# Patient Record
Sex: Female | Born: 1970 | Race: White | Hispanic: No | Marital: Married | Smoking: Former smoker
Health system: Southern US, Community
[De-identification: ages and names within clinical notes are randomized; demographics above are authoritative.]

## PROBLEM LIST (undated history)

## (undated) DIAGNOSIS — I4510 Unspecified right bundle-branch block: Secondary | ICD-10-CM

## (undated) DIAGNOSIS — R519 Headache, unspecified: Secondary | ICD-10-CM

## (undated) DIAGNOSIS — F32A Depression, unspecified: Secondary | ICD-10-CM

## (undated) DIAGNOSIS — Z87442 Personal history of urinary calculi: Secondary | ICD-10-CM

## (undated) DIAGNOSIS — I10 Essential (primary) hypertension: Secondary | ICD-10-CM

## (undated) DIAGNOSIS — F329 Major depressive disorder, single episode, unspecified: Secondary | ICD-10-CM

## (undated) DIAGNOSIS — R51 Headache: Secondary | ICD-10-CM

## (undated) DIAGNOSIS — Z8719 Personal history of other diseases of the digestive system: Secondary | ICD-10-CM

## (undated) DIAGNOSIS — K219 Gastro-esophageal reflux disease without esophagitis: Secondary | ICD-10-CM

## (undated) HISTORY — PX: TUBAL LIGATION: SHX77

## (undated) HISTORY — PX: CHOLECYSTECTOMY: SHX55

## (undated) HISTORY — PX: THYROIDECTOMY: SHX17

---

## 1999-08-12 ENCOUNTER — Encounter: Admission: RE | Admit: 1999-08-12 | Discharge: 1999-08-12 | Payer: Self-pay | Admitting: *Deleted

## 1999-08-12 ENCOUNTER — Encounter: Payer: Self-pay | Admitting: *Deleted

## 1999-09-28 ENCOUNTER — Other Ambulatory Visit: Admission: RE | Admit: 1999-09-28 | Discharge: 1999-09-28 | Payer: Self-pay | Admitting: Obstetrics and Gynecology

## 2007-07-24 ENCOUNTER — Emergency Department: Payer: Self-pay | Admitting: Emergency Medicine

## 2007-07-24 ENCOUNTER — Other Ambulatory Visit: Payer: Self-pay

## 2009-01-22 ENCOUNTER — Emergency Department (HOSPITAL_COMMUNITY): Admission: EM | Admit: 2009-01-22 | Discharge: 2009-01-22 | Payer: Self-pay | Admitting: Emergency Medicine

## 2009-03-17 ENCOUNTER — Emergency Department (HOSPITAL_COMMUNITY): Admission: EM | Admit: 2009-03-17 | Discharge: 2009-03-17 | Payer: Self-pay | Admitting: Family Medicine

## 2009-10-02 ENCOUNTER — Emergency Department (HOSPITAL_COMMUNITY): Admission: EM | Admit: 2009-10-02 | Discharge: 2009-10-02 | Payer: Self-pay | Admitting: Emergency Medicine

## 2009-10-15 ENCOUNTER — Ambulatory Visit: Payer: Self-pay | Admitting: Internal Medicine

## 2009-10-26 ENCOUNTER — Ambulatory Visit: Payer: Self-pay | Admitting: Surgery

## 2009-11-02 ENCOUNTER — Ambulatory Visit: Payer: Self-pay | Admitting: Surgery

## 2010-09-06 ENCOUNTER — Ambulatory Visit: Payer: Self-pay | Admitting: Surgery

## 2010-09-07 ENCOUNTER — Ambulatory Visit: Payer: Self-pay | Admitting: Surgery

## 2010-09-08 LAB — PATHOLOGY REPORT

## 2010-10-11 LAB — POCT I-STAT, CHEM 8
BUN: 10 mg/dL (ref 6–23)
Calcium, Ion: 1.2 mmol/L (ref 1.12–1.32)
Chloride: 105 mEq/L (ref 96–112)
Creatinine, Ser: 0.7 mg/dL (ref 0.4–1.2)
Glucose, Bld: 94 mg/dL (ref 70–99)
HCT: 44 % (ref 36.0–46.0)
Hemoglobin: 15 g/dL (ref 12.0–15.0)
Potassium: 3.7 mEq/L (ref 3.5–5.1)
Sodium: 139 mEq/L (ref 135–145)
TCO2: 27 mmol/L (ref 0–100)

## 2010-10-11 LAB — POCT CARDIAC MARKERS
CKMB, poc: <1 ng/mL — ABNORMAL LOW (ref 1.0–8.0)
Myoglobin, poc: 57.9 ng/mL (ref 12–200)
Troponin i, poc: <0.05 ng/mL (ref 0.00–0.09)

## 2010-10-24 LAB — URINALYSIS, ROUTINE W REFLEX MICROSCOPIC
Glucose, UA: NEGATIVE mg/dL
Leukocytes, UA: NEGATIVE
Protein, ur: 30 mg/dL — AB

## 2010-10-24 LAB — DIFFERENTIAL
Basophils Absolute: 0.3 10*3/uL — ABNORMAL HIGH (ref 0.0–0.1)
Basophils Relative: 3 % — ABNORMAL HIGH (ref 0–1)
Eosinophils Absolute: 0.1 10*3/uL (ref 0.0–0.7)
Eosinophils Relative: 1 % (ref 0–5)
Lymphocytes Relative: 23 % (ref 12–46)

## 2010-10-24 LAB — CBC
HCT: 38.9 % (ref 36.0–46.0)
MCV: 92.3 fL (ref 78.0–100.0)
Platelets: 316 10*3/uL (ref 150–400)
RDW: 12.6 % (ref 11.5–15.5)

## 2010-10-24 LAB — PREGNANCY, URINE: Preg Test, Ur: NEGATIVE

## 2010-10-24 LAB — POCT I-STAT, CHEM 8
Hemoglobin: 13.6 g/dL (ref 12.0–15.0)
Sodium: 139 mEq/L (ref 135–145)
TCO2: 20 mmol/L (ref 0–100)

## 2010-10-24 LAB — URINE MICROSCOPIC-ADD ON

## 2011-01-14 ENCOUNTER — Ambulatory Visit: Payer: Self-pay

## 2011-02-02 ENCOUNTER — Encounter: Payer: Self-pay | Admitting: Rheumatology

## 2011-02-16 ENCOUNTER — Encounter: Payer: Self-pay | Admitting: Rheumatology

## 2011-04-28 ENCOUNTER — Other Ambulatory Visit: Payer: Self-pay | Admitting: Unknown Physician Specialty

## 2011-08-29 ENCOUNTER — Ambulatory Visit: Payer: Self-pay | Admitting: Obstetrics and Gynecology

## 2011-08-29 LAB — CBC
HCT: 38.2 % (ref 35.0–47.0)
MCHC: 33.3 g/dL (ref 32.0–36.0)
MCV: 91 fL (ref 80–100)
RDW: 12.8 % (ref 11.5–14.5)

## 2011-08-29 LAB — PREGNANCY, URINE: Pregnancy Test, Urine: NEGATIVE m[IU]/mL

## 2011-08-29 LAB — POTASSIUM: Potassium: 3.6 mmol/L (ref 3.5–5.1)

## 2011-09-09 ENCOUNTER — Ambulatory Visit: Payer: Self-pay | Admitting: Gastroenterology

## 2012-10-10 IMAGING — CT CT ABD-PELV W/ CM
1 of 2 series · 15 of 32 positions shown, 19 images · non-contrast
Comparison: none

REASON FOR EXAM: nausea abd pain RLQ
COMMENTS:

[Series 2: 3mm soft tissue · axial · 0.78mm/px · z∈[-493,-79]mm · 15 of 150 slices shown, 19 images]
[im 6/150  soft-tissue]
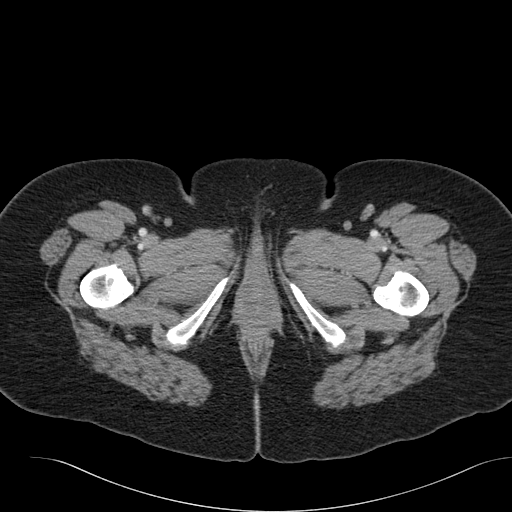
[im 6/150  bone]
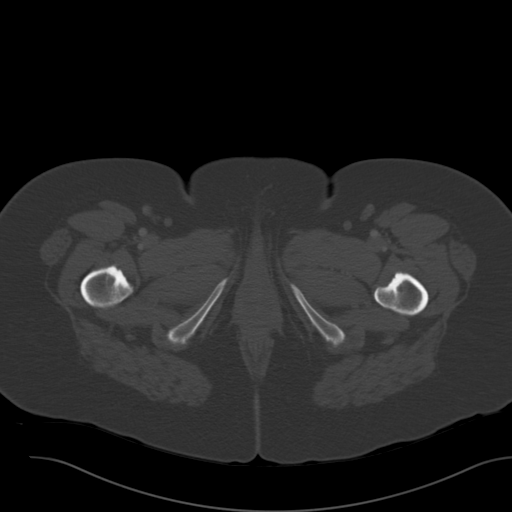
[im 18/150  soft-tissue]
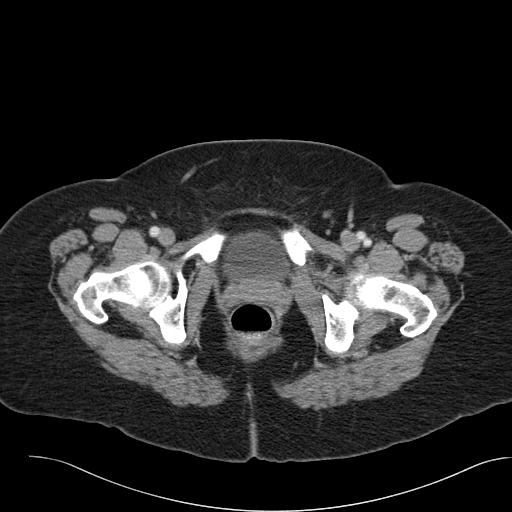
[im 30/150  soft-tissue]
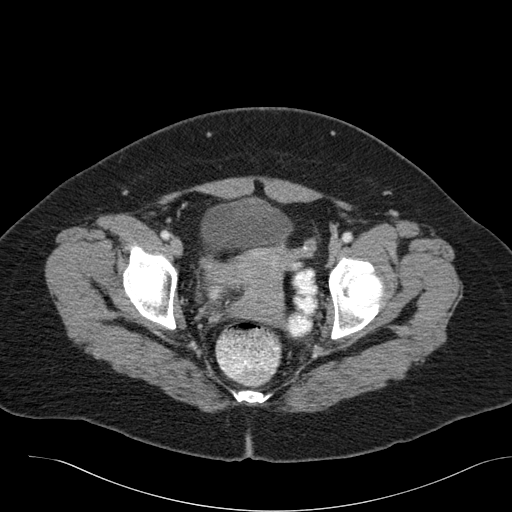
[im 42/150  soft-tissue]
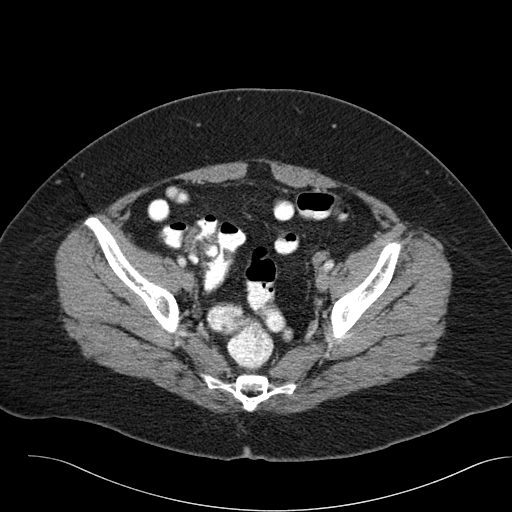
[im 54/150  soft-tissue]
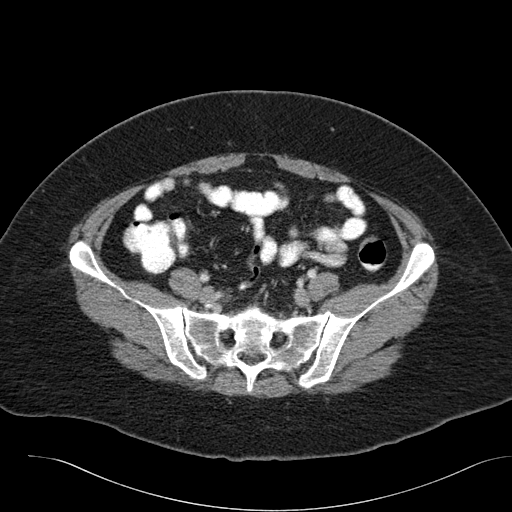
[im 66/150  soft-tissue]
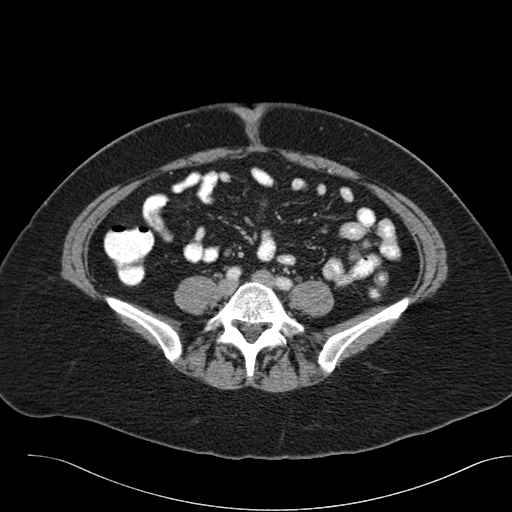
[im 78/150  soft-tissue]
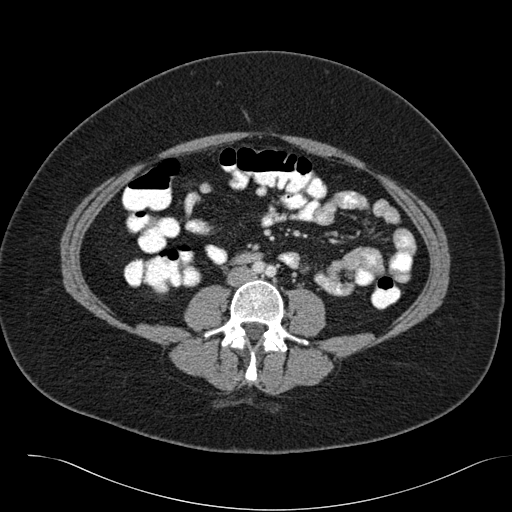
[im 84/150  soft-tissue]
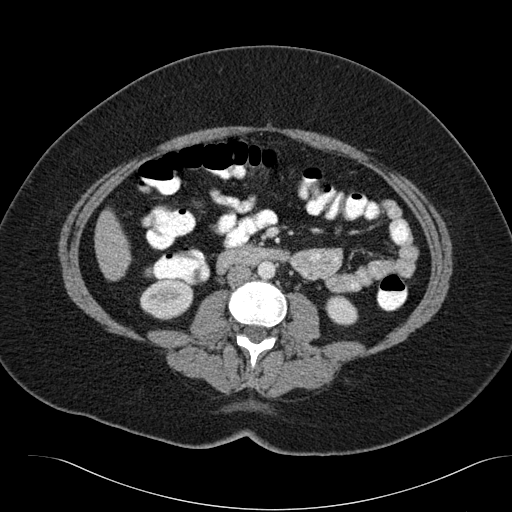
[im 96/150  soft-tissue]
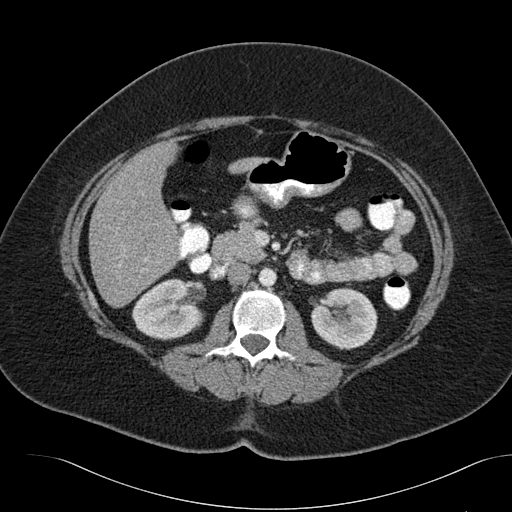
[im 96/150  bone]
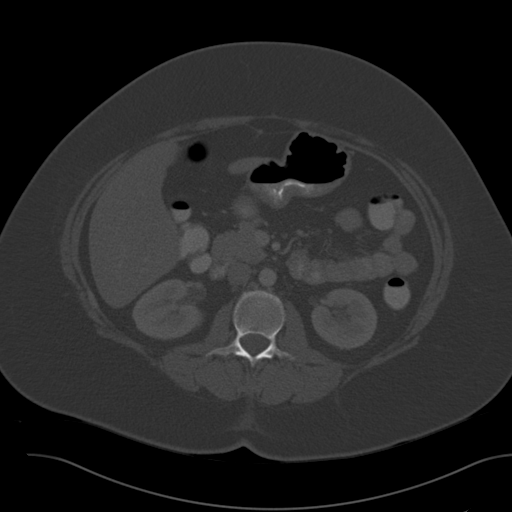
[im 108/150  soft-tissue]
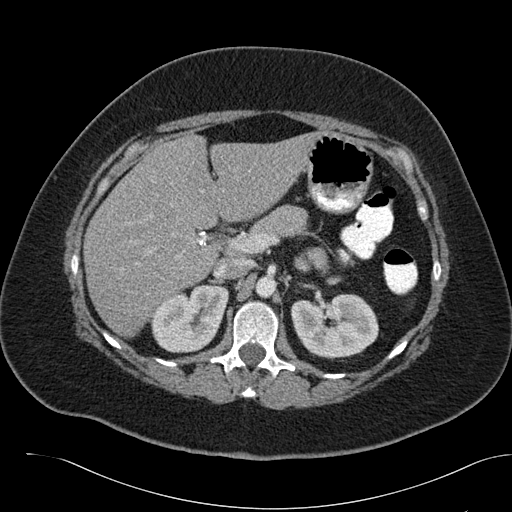
[im 120/150  soft-tissue]
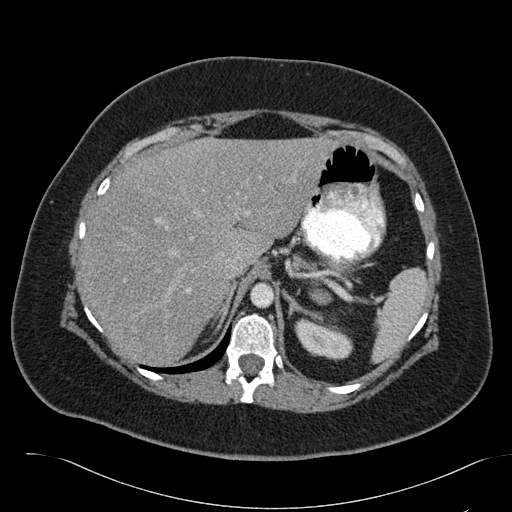
[im 126/150  lung]
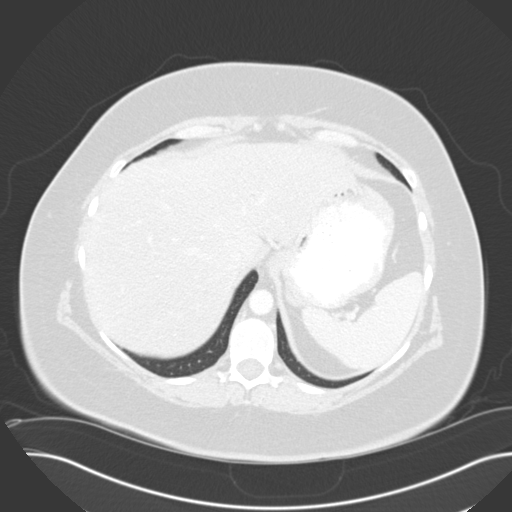
[im 132/150  soft-tissue]
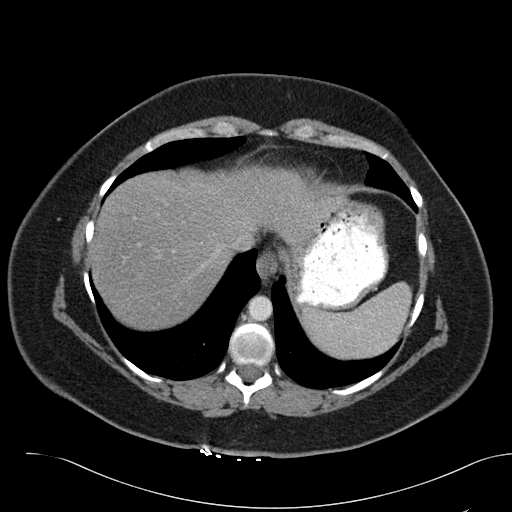
[im 132/150  lung]
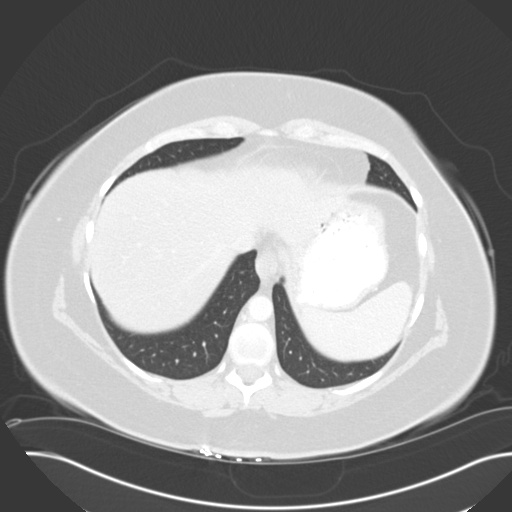
[im 138/150  lung]
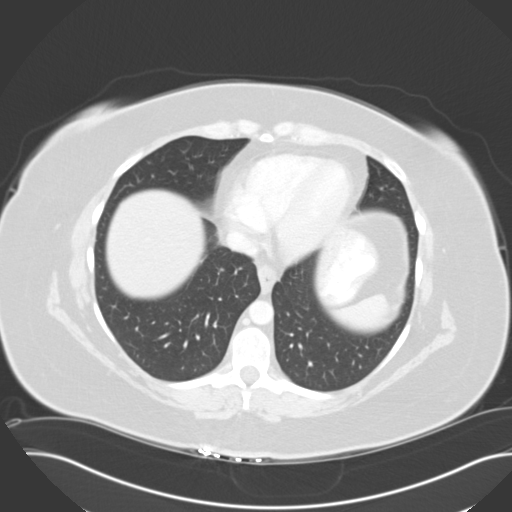
[im 144/150  soft-tissue]
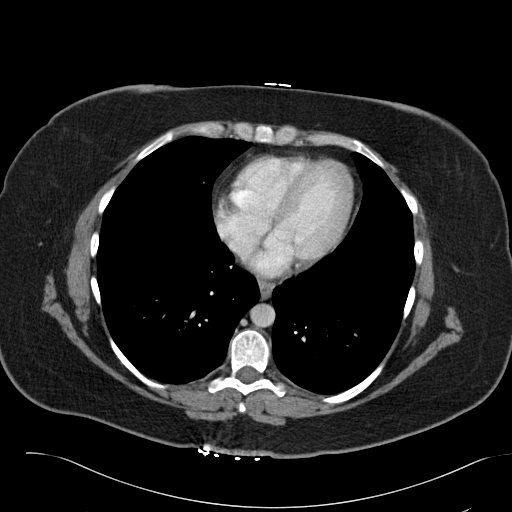
[im 144/150  lung]
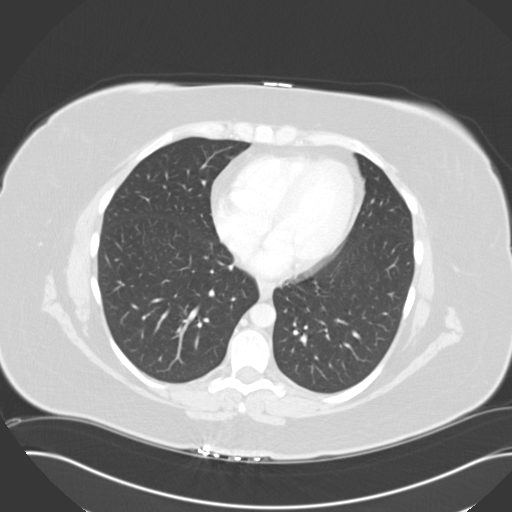

[15 of 32 positions shown; findings below may reference images not displayed]

PROCEDURE:     CT  - CT ABDOMEN / PELVIS  W  - September 09, 2011  [DATE]

RESULT:     Axial imaging was performed through the abdomen and pelvis with
reconstructions at 3 mm intervals and slice thicknesses. The patient
received 100 cc of Wsovue-JXX as well as oral contrast material. Review of
multiplanar reconstructed images was performed separately on the VIA monitor.

The gallbladder is surgically absent. The liver exhibits normal density with
no focal mass or ductal dilation. The spleen, partially distended stomach,
pancreas, adrenal glands, and kidneys are normal in appearance. The orally
administered contrast has traversed the small and large bowel. There is no
evidence of ileus nor of obstruction. A normal appearing contrast and gas
filled appendix is seen on images 95 through 104.

There is soft tissue fullness in the right aspect of the pelvis which may
reflect an inflamed ovary and fallopian tube. There are surgical clips
present in the pelvis from previous tubal ligation. I see no inflammatory
changes in the mesenteric fat of the right lower quadrant of the abdomen nor
of the pelvis. There is no free fluid in the pelvis. The uterus and urinary
bladder are normal in appearance. There is no inguinal hernia. There is a
shallow umbilical hernia. The lumbar vertebral bodies are preserved in
height. There is disc space narrowing at L5-S1. The lung bases are clear.
IMPRESSION: 1. There is no evidence of bowel obstruction or ileus or acute inflammation.
A normal appendix is demonstrated.
2. There is mild soft tissue fullness in the right adnexal region beginning
on image 109 and extending to image 120. This may reflect inflammatory
change associated with the right ovary and fallopian tube. The patient has
undergone previous tubal ligation. The uterus is normal in appearance.
3. I see no evidence of urinary tract stones or obstruction.
4. I see no acute hepatobiliary abnormality.

## 2014-11-09 NOTE — Op Note (Signed)
PATIENT NAMGarner Horn:  [name] MR#:  [id number] DATE OF BIRTH:  Jan 10, 1971  DATE OF PROCEDURE:  08/29/2011  PREOPERATIVE DIAGNOSIS: Desires sterilization.   POSTOPERATIVE DIAGNOSES:  1. Desires sterilization.  2. Suspect endometriosis.   OPERATIVE PROCEDURES: Laparoscopy with bilateral tubal banding with Falope ring.  SURGEON: Prentice DockerMartin A. Eupha Lobb, M.D.   FIRST ASSISTANT: None.   ANESTHESIA: General orotracheal.   INDICATION: The patient is a 44 year old white female para 0, on Micronor for contraception, who presents for surgical sterilization. The patient does not desire future children. She has a history of chronic hypertension and is unable to take combined oral contraceptives. Her husband declines vasectomy. The patient does have a history of dysmenorrhea and inspection for possible endometriosis was desired.  FINDINGS AT SURGERY: Grossly normal appearing uterus, tubes, and ovaries. There was evidence of some powder burn implants and scarring within the cul-de-sac. There also was some hyperemia and pseudofenestrations in the left pelvic sidewall. Likewise there was several powder burn implants on the right anterior abdominal wall just above the pelvic brim.   DESCRIPTION OF PROCEDURE: The patient was brought to the Operating Room where she was placed in the supine position. General endotracheal anesthesia was induced without difficulty. She was placed in the dorsal lithotomy position using the bumblebee stirrups. A ChloraPrep and Betadine abdominal, perineal, and intravaginal prep and drape was performed in the standard fashion. A red Robinson catheter was used to drain the bladder of urine. A Hulka tenaculum was placed onto the cervix to facilitate uterine manipulation. A subumbilical incision in a vertical manner was placed, 5 mm in length. The Optiview laparoscopic trocar system was used to place a 5 mm trocar directly into the abdomen without evidence of bowel or vascular injury. Thus  pneumoperitoneum was created. Inspection of the pelvis revealed the above-noted findings, which were photo-documented. An 8 mm port was placed in the suprapubic region, in the midline, under direct visualization. The Falope ring applicator was then placed through that port. Sequentially, the left and right fallopian tubes were then grasped with the Falope ring applicator and a knuckle of tube was pulled into the instruments, which then released the Falope ring. A good knuckle of tube was constricted. A similar procedure was carried out on the contralateral tube successfully. On completion of the tubal banding, further inspection of the pelvis revealed good hemostasis. The procedure was then terminated with all instrumentation being removed from the abdomen, the pneumoperitoneum was released, and the incisions were closed with Dermabond adhesive. The patient was then awakened, extubated, and taken to the Recovery Room in satisfactory condition. Estimated blood loss was minimal. There were no complications. All instrument, needle, and sponge counts were verified as correct. Visual diagnosis of endometriosis would give the patient the opportunity to proceed with hysterectomy if her dysmenorrhea recurs now that she will be discontinuing oral contraceptives.  ____________________________ Prentice DockerMartin A. Carrol Hougland, MD mad:slb D: 08/29/2011 20:13:04 ET T: 08/30/2011 09:21:16 ET JOB#: [id number]  cc: Daphine DeutscherMartin A. Arryana Tolleson, MD, <Dictator> Prentice DockerMARTIN A Jaevian Shean MD ELECTRONICALLY SIGNED 09/02/2011 14:40

## 2014-11-09 NOTE — H&P (Signed)
PATIENT NAMGarner Horn:  Horn, [name] MR#:  [id number] DATE OF BIRTH:  [birth year]  DATE OF ADMISSION:  08/29/2011  PREOPERATIVE DIAGNOSIS: Desires elective sterilization.  HISTORY: [name] is a 44 year old married white female, para 0, using Micronor for contraception at this time, who presents for surgical sterilization. The patient has a history of chronic hypertension. She desires to have no future children. Her husband declines vasectomy. The patient is willing to proceed with laparoscopic bilateral tubal banding.   PAST MEDICAL HISTORY:  1. Chronic hypertension.  2. Renal nephrolithiasis. 3. Anxiety.  4. Increased body mass index.   PAST SURGICAL HISTORY:  1. Cholecystectomy.  2. Thyroid nodule resection.   PAST OB HISTORY: Para zero.   PAST GYN HISTORY: Menarche age 44. The patient does have history of dysmenorrhea. She does have history of abnormal Pap smears years ago, but none in the past decade. She denies history of STI or PID.  FAMILY HISTORY: Negative for cancer of the colon, ovary, or breast. Family history is incomplete due to mom having been adopted.   SOCIAL HISTORY: The patient does not smoke. She does drink alcohol socially, approximately 3 to 5 drinks a week. She denies drug use. 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   REVIEW OF SYSTEMS: The patient denies recent illness. She denies history of reactive airway disease. She denies coagulopathy.    PHYSICAL EXAM:   VITALS: Height 5 feet 4 inches, weight 213, blood pressure 108/78, heart rate 87.  GENERAL: The patient is a pleasant well-appearing white female with appropriate affect. She is alert and oriented.   OROPHARYNX: Clear.   NECK: Supple. There is no thyromegaly or adenopathy.   LUNGS: Clear.   HEART: Regular rate and rhythm without murmur.   ABDOMEN: Soft, flat, and nontender. No organomegaly. No pelvic masses.   PELVIC: External genitalia normal. BUS normal. Vagina has good estrogen effect.  Cervix is nulliparous without lesions. The uterus is midplane, mobile, and normal size and shape. Adnexa are without masses.   RECTAL: External rectal exam is normal. Rectal sphincter has normal tone and no rectal masses are appreciated.   EXTREMITIES: No clubbing, cyanosis, or edema.   IMPRESSION: Desires elective sterilization.   PLAN: Laparoscopic bilateral tubal banding. Date of surgery is 08/29/2011.   CONSENT NOTE: [name] is to undergo laparoscopic BTB for contraception. She is understanding of the planned procedure and is aware of and is accepting of all surgical risks which include but are not limited to bleeding, infection, pelvic organ injury with need for repair,        blood clot disorders, anesthesia risks, and death. All of her questions are answered. Informed consent is given. The patient is ready and willing to proceed with surgery as scheduled. ____________________________ [name], MD mad:slb D: 08/24/2011 11:29:32 ET T: 08/24/2011 11:39:11 ET JOB#: [id number]  cc: Daphine DeutscherMartin A. Stepehn Eckard, MD, <Dictator> Prentice DockerMARTIN A Jonothan Heberle MD ELECTRONICALLY SIGNED 09/02/2011 14:38

## 2016-12-20 ENCOUNTER — Encounter: Payer: Self-pay | Admitting: General Surgery

## 2017-01-03 ENCOUNTER — Ambulatory Visit: Payer: Self-pay | Admitting: General Surgery

## 2017-01-12 ENCOUNTER — Encounter
Admission: RE | Admit: 2017-01-12 | Discharge: 2017-01-12 | Disposition: A | Discharge status: Home / Self Care | Payer: 59 | Source: Ambulatory Visit | Attending: Obstetrics and Gynecology | Admitting: Obstetrics and Gynecology

## 2017-01-12 ENCOUNTER — Encounter: Payer: Self-pay | Admitting: *Deleted

## 2017-01-12 HISTORY — DX: Headache, unspecified: R51.9

## 2017-01-12 HISTORY — DX: Headache: R51

## 2017-01-12 HISTORY — DX: Unspecified right bundle-branch block: I45.10

## 2017-01-12 HISTORY — DX: Personal history of other diseases of the digestive system: Z87.19

## 2017-01-12 HISTORY — DX: Gastro-esophageal reflux disease without esophagitis: K21.9

## 2017-01-12 HISTORY — DX: Personal history of urinary calculi: Z87.442

## 2017-01-12 HISTORY — DX: Essential (primary) hypertension: I10

## 2017-01-12 NOTE — Patient Instructions (Addendum)
  Your procedure is scheduled on: 01-20-17 FRIDAY Report to Same Day Surgery 2nd floor medical mall [facility](Medical Mall Entrance-take elevator on left to 2nd floor.  Check in with surgery information desk.) To find out your arrival time please call [phone] between 1PM - 3PM on 01-19-17 THURSDAY  Remember: Instructions that are not followed completely may result in serious medical risk, up to and including death, or upon the discretion of your surgeon and anesthesiologist your surgery may need to be rescheduled.    _x___ 1. Do not eat food or drink liquids after midnight. No gum chewing or hard candies.     __x__ 2. No Alcohol for 24 hours before or after surgery.   __x__3. No Smoking for 24 prior to surgery.   ____  4. Bring all medications with you on the day of surgery if instructed.    __x__ 5. Notify your doctor if there is any change in your medical condition     (cold, fever, infections).     Do not wear jewelry, make-up, hairpins, clips or nail polish.  Do not wear lotions, powders, or perfumes. You may wear deodorant.  Do not shave 48 hours prior to surgery. Men may shave face and neck.  Do not bring valuables to the hospital.    [facility] is not responsible for any belongings or valuables.               Contacts, dentures or bridgework may not be worn into surgery.  Leave your suitcase in the car. After surgery it may be brought to your room.  For patients admitted to the hospital, discharge time is determined by your  treatment team.   Patients discharged the day of surgery will not be allowed to drive home.  You will need someone to drive you home and stay with you the night of your procedure.    Please read over the following fact sheets that you were given:     _x___ TAKE THE FOLLOWING MEDICATIONS THE MORNING OF SURGERY WITH A SMALL SIP OF WATER. These include:  1. AMLODIPINE  2. BUPROPION  3. METOPROLOL  4. MICARDIS  5. PRILOSEC  6. TAKE AN EXTRA PRILOSEC ON  Thursday NIGHT BEFORE BED  ____Fleets enema or Magnesium Citrate as directed.   ____ Use CHG Soap or sage wipes as directed on instruction sheet   ____ Use inhalers on the day of surgery and bring to hospital day of surgery  ____ Stop Metformin and Janumet 2 days prior to surgery.    ____ Take 1/2 of usual insulin dose the night before surgery and none on the morning surgery.   _x___ Follow recommendations from Cardiologist, Pulmonologist or PCP regarding stopping Aspirin, Coumadin, Pllavix ,Eliquis, Effient, or Pradaxa, and Pletal-STOP 81 MG ASPIRIN NOW  X____Stop Anti-inflammatories such as Advil, Aleve, Ibuprofen, Motrin, Naproxen, Naprosyn, Goodies powders or aspirin products NOW-OK to take Tylenol    ____ Stop supplements until after surgery.     ____ Bring C-Pap to the hospital.

## 2017-01-12 NOTE — H&P (Signed)
HPI:  Pt presents for a preoperative visit to schedule a D&C, hysteroscopy.  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  Workup has included: 4/18: ENDOMETRIUM, BIOPSY:  COMPLEX MUCINOUS GLANDS OF UNCERTAIN SIGNIFICANCE ARE PRESENT  IN A BACKGROUND OF WEAKLY PROLIFERATIVE ENDOMETRIUM. SEE COMMENT.  COMMENT: A neoplastic process cannot be excluded. Clinical follow up  and further evaluation are recommended.   Past Medical History:  has a past medical history of Anxiety, unspecified; Calculus of gallbladder without mention of cholecystitis or obstruction; Diverticulitis of colon (without mention of hemorrhage)(562.11); Essential hypertension, benign; GERD (gastroesophageal reflux disease); Hallux valgus (acquired); Kidney stones; Plantar fascial fibromatosis; and Synovitis and tenosynovitis, unspecified.  Past Surgical History:  has a past surgical history that includes Cholecystectomy; Tubal ligation; and Resection of thyroid nodule (08/2010). Family History: family history includes Diabetes in her other; High blood pressure (Hypertension) in her father and mother; Hyperlipidemia (Elevated cholesterol) in her mother. Social History:  reports that [name] quit smoking about 22 years ago. [name] has never used smokeless tobacco. [name] reports that [name] drinks alcohol. [name] reports that [name] does not use drugs. OB/GYN History:     OB History    No data available      Allergies: is allergic to macrobid [nitrofurantoin monohyd/m-cryst] and other. Medications:  Current Outpatient Prescriptions:  .  amLODIPine (NORVASC) 5 MG tablet, TAKE ONE TABLET BY MOUTH ONCE DAILY, Disp: 90 tablet, Rfl: 3 .  aspirin 81 MG EC tablet, Take 81 mg by mouth once daily., Disp: , Rfl:  .  buPROPion (WELLBUTRIN XL) 150 MG XL tablet, TAKE TWO TABLETS BY MOUTH ONCE DAILY, Disp: 60 tablet, Rfl: 11 .  etodolac (LODINE) 500 MG tablet, Take 1 tablet  (500 mg total) by mouth 2 (two) times daily. (Patient not taking: Reported on 09/19/2016 ), Disp: 30 tablet, Rfl: 0 .  hydroCHLOROthiazide (HYDRODIURIL) 12.5 MG tablet, TAKE 1 TABLET BY MOUTH ONCE DAILY, Disp: 90 tablet, Rfl: 1 .  hydrocodone-homatropine (HYCODAN) 5-1.5 mg/5 mL syrup, Take 5 mLs by mouth every 6 (six) hours as needed for Cough. (Patient not taking: Reported on 07/28/2016 ), Disp: 120 mL, Rfl: 0 .  medroxyPROGESTERone (PROVERA) 10 MG tablet, Take 1 tablet daily until bleeding stops then take 1/2 tablet for 5 days then stop., Disp: 30 tablet, Rfl: 0 .  metoprolol succinate (TOPROL-XL) 50 MG XL tablet, TAKE ONE TABLET BY MOUTH ONCE DAILY, Disp: 90 tablet, Rfl: 3 .  omeprazole (PRILOSEC) 20 MG DR capsule, TAKE ONE CAPSULE BY MOUTH ONCE DAILY, Disp: 90 capsule, Rfl: 3 .  ondansetron (ZOFRAN-ODT) 4 MG disintegrating tablet, Take 1 tablet (4 mg total) by mouth every 8 (eight) hours as needed for Nausea or Vomiting. (Patient not taking: Reported on 07/28/2016 ), Disp: 20 tablet, Rfl: 0 .  telmisartan (MICARDIS) 40 MG tablet, TAKE ONE TABLET BY MOUTH ONCE DAILY, Disp: 90 tablet, Rfl: 3 .  traMADol (ULTRAM) 50 mg tablet, Take 1 tablet (50 mg total) by mouth every 6 (six) hours as needed for Pain. (Patient not taking: Reported on 09/19/2016 ), Disp: 30 tablet, Rfl: 0 .  triamcinolone 0.1 % cream, Apply topically 2 (two) times daily. (Patient not taking: Reported on 09/19/2016 ), Disp: 15 g, Rfl: 0 .  valACYclovir (VALTREX) 1000 MG tablet, Take 1 tablet (1,000 mg total) by mouth 3 (three) times daily. (Patient not taking: Reported on 07/28/2016 ), Disp: 21 tablet, Rfl: 0  Review of Systems:  No SOB, no palpitations or chest pain, no new lower extremity edema, no nausea or vomiting or bowel or bladder complaints. See HPI for gyn specific ROS.   Exam:      Vitals:   11/23/16 1437  BP: 129/85  Pulse: 80    WDWN   female in NAD Body mass index is 33.61 kg/m.  General: Patient is  well-groomed, well-nourished, appears stated age in no acute distress  HEENT: head is atraumatic and normocephalic, trachea is midline, neck is supple with no palpable nodules  CV: Regular rhythm and normal heart rate, no murmur  Pulm: Clear to auscultation throughout lung fields with no wheezing, crackles, or rhonchi. No increased work of breathing  Abdomen: soft , no mass, non-tender, no rebound tenderness, no hepatomegaly  Pelvic: Deferred  Impression:   The primary encounter diagnosis was Menorrhagia with irregular cycle. A diagnosis of Other abnormal cytological finding of specimen from cervix was also pertinent to this visit.    Plan:   -  Preoperative visit: D&C hysteroscopy. Consents signed today. Risks of surgery were discussed with the patient including but not limited to: bleeding which may require transfusion; infection which may require antibiotics; injury to uterus or surrounding organs; intrauterine scarring which may impair future fertility; need for additional procedures including laparotomy or laparoscopy; and other postoperative/anesthesia complications. Written informed consent was obtained.  This is a scheduled same-day surgery. [name] will have a postop visit in 2 weeks to review operative findings and pathology.  - consider in office novasure if benign and continued bleeding

## 2017-01-17 ENCOUNTER — Encounter
Admission: RE | Admit: 2017-01-17 | Discharge: 2017-01-17 | Disposition: A | Discharge status: Home / Self Care | Payer: 59 | Source: Ambulatory Visit | Attending: Obstetrics and Gynecology | Admitting: Obstetrics and Gynecology

## 2017-01-17 DIAGNOSIS — K219 Gastro-esophageal reflux disease without esophagitis: Secondary | ICD-10-CM | POA: Diagnosis not present

## 2017-01-17 DIAGNOSIS — Z79899 Other long term (current) drug therapy: Secondary | ICD-10-CM | POA: Diagnosis not present

## 2017-01-17 DIAGNOSIS — Z8719 Personal history of other diseases of the digestive system: Secondary | ICD-10-CM | POA: Diagnosis not present

## 2017-01-17 DIAGNOSIS — N920 Excessive and frequent menstruation with regular cycle: Secondary | ICD-10-CM | POA: Diagnosis present

## 2017-01-17 DIAGNOSIS — Z881 Allergy status to other antibiotic agents status: Secondary | ICD-10-CM | POA: Diagnosis not present

## 2017-01-17 DIAGNOSIS — R896 Abnormal cytological findings in specimens from other organs, systems and tissues: Secondary | ICD-10-CM | POA: Diagnosis not present

## 2017-01-17 DIAGNOSIS — N8502 Endometrial intraepithelial neoplasia [EIN]: Secondary | ICD-10-CM | POA: Diagnosis not present

## 2017-01-17 DIAGNOSIS — Z7982 Long term (current) use of aspirin: Secondary | ICD-10-CM | POA: Diagnosis not present

## 2017-01-17 DIAGNOSIS — Z87891 Personal history of nicotine dependence: Secondary | ICD-10-CM | POA: Diagnosis not present

## 2017-01-17 DIAGNOSIS — I10 Essential (primary) hypertension: Secondary | ICD-10-CM | POA: Diagnosis not present

## 2017-01-17 DIAGNOSIS — F419 Anxiety disorder, unspecified: Secondary | ICD-10-CM | POA: Diagnosis not present

## 2017-01-17 LAB — TYPE AND SCREEN
ABO/RH(D): O POS
Antibody Screen: NEGATIVE

## 2017-01-17 LAB — CBC
HCT: 39 % (ref 35.0–47.0)
Hemoglobin: 13.4 g/dL (ref 12.0–16.0)
MCH: 31.3 pg (ref 26.0–34.0)
MCHC: 34.2 g/dL (ref 32.0–36.0)
MCV: 91.4 fL (ref 80.0–100.0)
PLATELETS: 348 10*3/uL (ref 150–440)
RBC: 4.27 MIL/uL (ref 3.80–5.20)
RDW: 13.1 % (ref 11.5–14.5)
WBC: 8.7 10*3/uL (ref 3.6–11.0)

## 2017-01-17 LAB — BASIC METABOLIC PANEL
Anion gap: 8 (ref 5–15)
BUN: 7 mg/dL (ref 6–20)
CALCIUM: 9.2 mg/dL (ref 8.9–10.3)
CO2: 26 mmol/L (ref 22–32)
CREATININE: 0.69 mg/dL (ref 0.44–1.00)
Chloride: 105 mmol/L (ref 101–111)
GFR calc Af Amer: >60 mL/min (ref >60)
GLUCOSE: 96 mg/dL (ref 65–99)
Potassium: 3.2 mmol/L — ABNORMAL LOW (ref 3.5–5.1)
Sodium: 139 mmol/L (ref 135–145)

## 2017-01-20 ENCOUNTER — Ambulatory Visit
Admission: RE | Admit: 2017-01-20 | Discharge: 2017-01-20 | Disposition: A | Discharge status: Home / Self Care | Payer: 59 | Source: Ambulatory Visit | Attending: Obstetrics and Gynecology | Admitting: Obstetrics and Gynecology

## 2017-01-20 ENCOUNTER — Ambulatory Visit: Payer: 59 | Admitting: Certified Registered Nurse Anesthetist

## 2017-01-20 ENCOUNTER — Encounter: Payer: Self-pay | Admitting: *Deleted

## 2017-01-20 ENCOUNTER — Encounter
Admission: RE | Disposition: A | Discharge status: Home / Self Care | Payer: Self-pay | Source: Ambulatory Visit | Attending: Obstetrics and Gynecology

## 2017-01-20 DIAGNOSIS — N8502 Endometrial intraepithelial neoplasia [EIN]: Secondary | ICD-10-CM | POA: Diagnosis not present

## 2017-01-20 DIAGNOSIS — Z8719 Personal history of other diseases of the digestive system: Secondary | ICD-10-CM | POA: Insufficient documentation

## 2017-01-20 DIAGNOSIS — F419 Anxiety disorder, unspecified: Secondary | ICD-10-CM | POA: Insufficient documentation

## 2017-01-20 DIAGNOSIS — Z7982 Long term (current) use of aspirin: Secondary | ICD-10-CM | POA: Insufficient documentation

## 2017-01-20 DIAGNOSIS — R896 Abnormal cytological findings in specimens from other organs, systems and tissues: Secondary | ICD-10-CM | POA: Insufficient documentation

## 2017-01-20 DIAGNOSIS — Z881 Allergy status to other antibiotic agents status: Secondary | ICD-10-CM | POA: Insufficient documentation

## 2017-01-20 DIAGNOSIS — Z87891 Personal history of nicotine dependence: Secondary | ICD-10-CM | POA: Insufficient documentation

## 2017-01-20 DIAGNOSIS — K219 Gastro-esophageal reflux disease without esophagitis: Secondary | ICD-10-CM | POA: Insufficient documentation

## 2017-01-20 DIAGNOSIS — N920 Excessive and frequent menstruation with regular cycle: Secondary | ICD-10-CM | POA: Insufficient documentation

## 2017-01-20 DIAGNOSIS — I10 Essential (primary) hypertension: Secondary | ICD-10-CM | POA: Insufficient documentation

## 2017-01-20 DIAGNOSIS — Z79899 Other long term (current) drug therapy: Secondary | ICD-10-CM | POA: Insufficient documentation

## 2017-01-20 HISTORY — PX: HYSTEROSCOPY WITH D & C: SHX1775

## 2017-01-20 LAB — POCT I-STAT 4, (NA,K, GLUC, HGB,HCT)
Glucose, Bld: 96 mg/dL (ref 65–99)
HCT: 39 % (ref 36.0–46.0)
Hemoglobin: 13.3 g/dL (ref 12.0–15.0)
POTASSIUM: 4.6 mmol/L (ref 3.5–5.1)
Sodium: 140 mmol/L (ref 135–145)

## 2017-01-20 LAB — ABO/RH: ABO/RH(D): O POS

## 2017-01-20 LAB — POCT PREGNANCY, URINE: PREG TEST UR: NEGATIVE

## 2017-01-20 SURGERY — DILATATION AND CURETTAGE /HYSTEROSCOPY
Anesthesia: General | Wound class: Clean Contaminated

## 2017-01-20 MED ORDER — FENTANYL CITRATE (PF) 100 MCG/2ML IJ SOLN
INTRAMUSCULAR | Status: AC
  Filled 2017-01-20: qty 2

## 2017-01-20 MED ORDER — KETOROLAC TROMETHAMINE 30 MG/ML IJ SOLN
INTRAMUSCULAR | Status: AC
  Filled 2017-01-20: qty 1

## 2017-01-20 MED ORDER — PROPOFOL 10 MG/ML IV BOLUS
INTRAVENOUS | Status: DC | PRN
  Administered 2017-01-20: 180 mg via INTRAVENOUS

## 2017-01-20 MED ORDER — KETOROLAC TROMETHAMINE 30 MG/ML IJ SOLN
INTRAMUSCULAR | Status: DC | PRN
  Administered 2017-01-20: 30 mg via INTRAVENOUS

## 2017-01-20 MED ORDER — ACETAMINOPHEN 10 MG/ML IV SOLN
INTRAVENOUS | Status: DC | PRN
  Administered 2017-01-20: 1000 mg via INTRAVENOUS

## 2017-01-20 MED ORDER — DEXAMETHASONE SODIUM PHOSPHATE 10 MG/ML IJ SOLN
INTRAMUSCULAR | Status: DC | PRN
  Administered 2017-01-20: 10 mg via INTRAVENOUS

## 2017-01-20 MED ORDER — PROMETHAZINE HCL 25 MG/ML IJ SOLN: 6.2500 mg | INTRAMUSCULAR | Status: DC | PRN

## 2017-01-20 MED ORDER — LIDOCAINE HCL (PF) 2 % IJ SOLN
INTRAMUSCULAR | Status: AC
  Filled 2017-01-20: qty 2

## 2017-01-20 MED ORDER — LIDOCAINE HCL (CARDIAC) 20 MG/ML IV SOLN
INTRAVENOUS | Status: DC | PRN
  Administered 2017-01-20: 60 mg via INTRAVENOUS

## 2017-01-20 MED ORDER — ONDANSETRON HCL 4 MG/2ML IJ SOLN
INTRAMUSCULAR | Status: AC
  Filled 2017-01-20: qty 2

## 2017-01-20 MED ORDER — DOCUSATE SODIUM 100 MG PO CAPS: 100.0000 mg | ORAL_CAPSULE | Freq: Two times a day (BID) | ORAL | 0 refills | Status: AC

## 2017-01-20 MED ORDER — LACTATED RINGERS IV SOLN
INTRAVENOUS | Status: DC
  Administered 2017-01-20: 07:00:00 via INTRAVENOUS

## 2017-01-20 MED ORDER — DEXAMETHASONE SODIUM PHOSPHATE 10 MG/ML IJ SOLN
INTRAMUSCULAR | Status: AC
  Filled 2017-01-20: qty 1

## 2017-01-20 MED ORDER — PROPOFOL 10 MG/ML IV BOLUS
INTRAVENOUS | Status: AC
  Filled 2017-01-20: qty 20

## 2017-01-20 MED ORDER — ACETAMINOPHEN NICU IV SYRINGE 10 MG/ML
INTRAVENOUS | Status: AC
  Filled 2017-01-20: qty 1

## 2017-01-20 MED ORDER — MIDAZOLAM HCL 2 MG/2ML IJ SOLN
INTRAMUSCULAR | Status: AC
  Filled 2017-01-20: qty 2

## 2017-01-20 MED ORDER — SEVOFLURANE IN SOLN
RESPIRATORY_TRACT | Status: AC
  Filled 2017-01-20: qty 250

## 2017-01-20 MED ORDER — LACTATED RINGERS IV SOLN: INTRAVENOUS | Status: DC

## 2017-01-20 MED ORDER — FENTANYL CITRATE (PF) 100 MCG/2ML IJ SOLN: 25.0000 ug | INTRAMUSCULAR | Status: DC | PRN

## 2017-01-20 MED ORDER — MIDAZOLAM HCL 2 MG/2ML IJ SOLN
INTRAMUSCULAR | Status: DC | PRN
  Administered 2017-01-20: 2 mg via INTRAVENOUS

## 2017-01-20 MED ORDER — ONDANSETRON HCL 4 MG/2ML IJ SOLN
INTRAMUSCULAR | Status: DC | PRN
  Administered 2017-01-20: 4 mg via INTRAVENOUS

## 2017-01-20 MED ORDER — FENTANYL CITRATE (PF) 100 MCG/2ML IJ SOLN
INTRAMUSCULAR | Status: DC | PRN
  Administered 2017-01-20 (×4): 25 ug via INTRAVENOUS

## 2017-01-20 MED ORDER — IBUPROFEN 800 MG PO TABS: 800.0000 mg | ORAL_TABLET | Freq: Three times a day (TID) | ORAL | 1 refills | Status: AC | PRN

## 2017-01-20 SURGICAL SUPPLY — 21 items
BAG INFUSER PRESSURE 100CC (MISCELLANEOUS) ×3 IMPLANT
CANISTER SUCT 3000ML PPV (MISCELLANEOUS) ×3 IMPLANT
CATH ROBINSON RED A/P 16FR (CATHETERS) ×3 IMPLANT
DRAPE INCISE 23X17 IOBAN STRL (DRAPES) ×2
DRAPE INCISE 23X17 STRL (DRAPES) IMPLANT
DRAPE INCISE IOBAN 23X17 STRL (DRAPES) ×1 IMPLANT
ELECT REM PT RETURN 9FT ADLT (ELECTROSURGICAL) ×3
ELECTRODE REM PT RTRN 9FT ADLT (ELECTROSURGICAL) ×1 IMPLANT
GLOVE BIO SURGEON STRL SZ7 (GLOVE) ×3 IMPLANT
GLOVE INDICATOR 7.5 STRL GRN (GLOVE) ×3 IMPLANT
GOWN STRL REUS W/ TWL LRG LVL3 (GOWN DISPOSABLE) ×2 IMPLANT
GOWN STRL REUS W/TWL LRG LVL3 (GOWN DISPOSABLE) ×6
IV LACTATED RINGERS 1000ML (IV SOLUTION) ×3 IMPLANT
KIT RM TURNOVER CYSTO AR (KITS) ×3 IMPLANT
PACK DNC HYST (MISCELLANEOUS) ×3 IMPLANT
PAD OB MATERNITY 4.3X12.25 (PERSONAL CARE ITEMS) ×3 IMPLANT
PAD PREP 24X41 OB/GYN DISP (PERSONAL CARE ITEMS) ×3 IMPLANT
SUT VIC AB 0 CT1 36 (SUTURE) ×2 IMPLANT
TUBING CONNECTING 10 (TUBING) ×2 IMPLANT
TUBING CONNECTING 10' (TUBING) ×1
TUBING HYSTEROSCOPY DOLPHIN (MISCELLANEOUS) ×3 IMPLANT

## 2017-01-20 NOTE — Transfer of Care (Signed)
Immediate Anesthesia Transfer of Care Note  Patient: [name]  Procedure(s) Performed: Procedure(s): DILATATION AND CURETTAGE /HYSTEROSCOPY (N/A)  Patient Location: PACU  Anesthesia Type:General  Level of Consciousness: awake and alert   Airway & Oxygen Therapy: Patient Spontanous Breathing and Patient connected to nasal cannula oxygen  Post-op Assessment: Report given to RN and Post -op Vital signs reviewed and stable  Post vital signs: Reviewed and stable  Last Vitals:  Vitals:   01/20/17 0613 01/20/17 0845  BP: 127/77 (!) 100/39  Pulse: 80 89  Resp: 16 13  Temp: 36.6 C (!) 36.1 C    Last Pain:  Vitals:   01/20/17 0613  TempSrc: Oral  PainSc: 1          Complications: No apparent anesthesia complications

## 2017-01-20 NOTE — Interval H&P Note (Signed)
History and Physical Interval Note:  01/20/2017 7:43 AM  [name]  has presented today for surgery, with the diagnosis of AUB  The various methods of treatment have been discussed with the patient and family. After consideration of risks, benefits and other options for treatment, the patient has consented to  Procedure(s): DILATATION AND CURETTAGE /HYSTEROSCOPY (N/A) as a surgical intervention .  The patient's history has been reviewed, patient examined, no change in status, stable for surgery.  I have reviewed the patient's chart and labs.  Questions were answered to the patient's satisfaction.     Christeen DouglasBethany Zamarion Longest

## 2017-01-20 NOTE — Discharge Instructions (Signed)

## 2017-01-20 NOTE — Anesthesia Postprocedure Evaluation (Signed)
Anesthesia Post Note  Patient: [name]  Procedure(s) Performed: Procedure(s) (LRB): DILATATION AND CURETTAGE /HYSTEROSCOPY/CERVICAL BIOPSY (N/A)  Patient location during evaluation: PACU Anesthesia Type: General Level of consciousness: awake and alert Pain management: pain level controlled Vital Signs Assessment: post-procedure vital signs reviewed and stable Respiratory status: spontaneous breathing, nonlabored ventilation, respiratory function stable and patient connected to nasal cannula oxygen Cardiovascular status: blood pressure returned to baseline and stable Postop Assessment: no signs of nausea or vomiting Anesthetic complications: no     Last Vitals:  Vitals:   01/20/17 0915 01/20/17 0937  BP: 131/73 129/69  Pulse: 82 75  Resp: 16 16  Temp:      Last Pain:  Vitals:   01/20/17 0613  TempSrc: Oral  PainSc: 1                  Lenard SimmerAndrew Real Cona

## 2017-01-20 NOTE — Anesthesia Post-op Follow-up Note (Cosign Needed)
Anesthesia QCDR form completed.        

## 2017-01-20 NOTE — Anesthesia Preprocedure Evaluation (Signed)
Anesthesia Evaluation  Patient identified by MRN, date of birth, ID band Patient awake    Reviewed: Allergy & Precautions, H&P , NPO status , Patient's Chart, lab work & pertinent test results, reviewed documented beta blocker date and time   History of Anesthesia Complications Negative for: history of anesthetic complications  Airway Mallampati: I  TM Distance: >3 FB Neck ROM: full    Dental  (+) Teeth Intact Permanent retainer:   Pulmonary neg pulmonary ROS, former smoker,           Cardiovascular Exercise Tolerance: Good hypertension, (-) angina(-) CAD, (-) Past MI, (-) Cardiac Stents and (-) CABG + dysrhythmias (-) Valvular Problems/Murmurs     Neuro/Psych negative neurological ROS  negative psych ROS   GI/Hepatic Neg liver ROS, hiatal hernia, GERD  ,  Endo/Other  negative endocrine ROS  Renal/GU Renal disease (stones)  negative genitourinary   Musculoskeletal   Abdominal   Peds  Hematology negative hematology ROS (+)   Anesthesia Other Findings Past Medical History: No date: GERD (gastroesophageal reflux disease) No date: Headache     Comment: H/O MIGRAINES No date: History of hiatal hernia No date: History of kidney stones No date: Hypertension No date: Right bundle branch block   Reproductive/Obstetrics negative OB ROS                             Anesthesia Physical Anesthesia Plan  ASA: II  Anesthesia Plan: General   Post-op Pain Management:    Induction: Intravenous  PONV Risk Score and Plan: 3 and Ondansetron and Dexamethasone  Airway Management Planned: LMA  Additional Equipment:   Intra-op Plan:   Post-operative Plan: Extubation in OR  Informed Consent: I have reviewed the patients History and Physical, chart, labs and discussed the procedure including the risks, benefits and alternatives for the proposed anesthesia with the patient or authorized  representative who has indicated his/her understanding and acceptance.   Dental Advisory Given  Plan Discussed with: Anesthesiologist, CRNA and Surgeon  Anesthesia Plan Comments:         Anesthesia Quick Evaluation

## 2017-01-20 NOTE — Anesthesia Procedure Notes (Signed)
Procedure Name: LMA Insertion Date/Time: 01/20/2017 7:53 AM Performed by: Dava NajjarFRAZIER, Hajer Dwyer Pre-anesthesia Checklist: Patient identified, Emergency Drugs available, Suction available, Patient being monitored and Timeout performed Patient Re-evaluated:Patient Re-evaluated prior to inductionOxygen Delivery Method: Circle system utilized Preoxygenation: Pre-oxygenation with 100% oxygen Intubation Type: IV induction Ventilation: Mask ventilation without difficulty LMA: LMA inserted LMA Size: 4.0 Number of attempts: 1 Placement Confirmation: positive ETCO2 Tube secured with: Tape Dental Injury: Teeth and Oropharynx as per pre-operative assessment

## 2017-01-20 NOTE — Op Note (Signed)
Operative Report Hysteroscopy with Dilation and Curettage   Indications: Menorrhagia   Pre-operative Diagnosis: Abnormal uterine bleeding Abnormal Pap smear    Post-operative Diagnosis: same.  Procedure: 1. Exam under anesthesia 2. Fractional D&C 3. Hysteroscopy  4. Cervical biopsy  Surgeon: Christeen DouglasBethany Temia Debroux, MD  Assistant(s):  None  Anesthesia: General LMA anesthesia  Anesthesiologist: Lenard SimmerKarenz, Andrew, MD Anesthesiologist: Lenard SimmerKarenz, Andrew, MD CRNA: Dava NajjarFrazier, Susan, CRNA  Estimated Blood Loss:  20mL         Intraoperative medications: Toradol, IV acetominophen         Total IV Fluids: 400ml  Urine Output: 300ml  Total Fluid Deficit:  n/a          Specimens: Ectocervix biopsy, Endocervical curettings, endometrial curettings         Complications:  None; patient tolerated the procedure well.         Disposition: PACU - hemodynamically stable.         Condition: stable  Findings: Uterus measuring 8 cm by sound; normal vagina, perineum. 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 here for scheduled surgery for the aforementioned diagnoses.  Risks of surgery were discussed with the patient including but not limited to: bleeding which may require transfusion; infection which may require antibiotics; injury to uterus or surrounding organs; intrauterine scarring which may impair future fertility; need for additional procedures including laparotomy or laparoscopy; and other postoperative/anesthesia complications. Written informed consent was obtained.    Procedure Details:  Fractional D&C only  The patient was taken to the operating room where anesthesia was administered and was found to be adequate. After a formal and adequate timeout was performed, she was placed in the dorsal lithotomy position and examined with the above findings. She was then prepped and draped in the sterile manner. Her bladder was catheterized for an estimated  amount of clear, yellow urine. A weighed speculum was then placed in the patient's vagina. Tischler forceps were used to take 2 biopsies at 11 and 12:00 on the anterior lip of the cervix and a single tooth tenaculum was applied to the anterior lip of the cervix.  Her cervix was serially dilated to 15 [place] using Hanks dilators. An ECC was performed. The hysteroscope was introduced to reveal the above findings. A sharp curettage was then performed until there was a gritty texture in all four quadrants. The tenaculum was removed from the anterior lip of the cervix and the vaginal speculum was removed after applying a 0-vicryl stitch to the site of the cervical biopsy for good hemostasis.   The patient tolerated the procedure well and was taken to the recovery area awake and in stable condition. She received iv acetaminophen and Toradol prior to leaving the OR.  The patient will be discharged to home as per PACU criteria. Routine postoperative instructions given. She was prescribed Ibuprofen and Colace. She will follow up in the clinic in two weeks for postoperative evaluation.

## 2017-01-25 LAB — SURGICAL PATHOLOGY

## 2017-03-07 NOTE — H&P (Signed)
Patient ID: [name] is a 46 y.o. female presenting with Pre Op Consulting  on 03/07/2017  HPI: Hx of AUB  - D&C on 01/20/17 with EIN (complex hyperplasia with atypia)   After discussion with gyn onc, we have recommended  Total lap hysterectomy with bilateral salpingectomy and sentinel lymph node biopsy. Dr. Sonia Side will be coming to assist with bx and staging if needed. We will plan for frozen section.  Past Medical History:  has a past medical history of Abnormal cytology (1998); Allergic state (hay fever); Anxiety, unspecified; Calculus of gallbladder without mention of cholecystitis or obstruction; Depression, unspecified; Diverticulitis of colon (without mention of hemorrhage)(562.11); Essential hypertension, benign; GERD (gastroesophageal reflux disease); Hallux valgus (acquired); Kidney stones; Plantar fascial fibromatosis; and Synovitis and tenosynovitis, unspecified.  Past Surgical History:  has a past surgical history that includes Cholecystectomy; Tubal ligation; Resection of thyroid nodule (08/2010); Dilation and curettage of uterus; and Hysteroscopy. Family History: family history includes Alcohol abuse in her mother; Allergic rhinitis in her mother; Anxiety in her father and mother; Asthma in her sister; Colon polyps in her father and mother; Depression in her father and mother; Diabetes in her other; Diabetes type II in her maternal grandfather, paternal aunt, and paternal grandmother; High blood pressure (Hypertension) in her father and mother; Hyperlipidemia (Elevated cholesterol) in her mother; Obesity in her mother. Social History:  reports that she quit smoking about 22 years ago. She has a 0.50 pack-year smoking history. She has never used smokeless tobacco. She reports that she drinks alcohol. She reports that she does not use drugs. OB/GYN History:          OB History    Gravida Para Term Preterm AB Living   0 0 0 0 0 0   SAB TAB Ectopic Molar Multiple Live Births   0  0 0 0 0 0      Allergies: is allergic to macrobid [nitrofurantoin monohyd/m-cryst] and other. Medications:  Current Outpatient Prescriptions:  .  ALPRAZolam (XANAX) 0.25 MG tablet, Take 1 tablet (0.25 mg total) by mouth once daily as needed for Sleep., Disp: 30 tablet, Rfl: 0 .  amLODIPine (NORVASC) 5 MG tablet, TAKE ONE TABLET BY MOUTH ONCE DAILY, Disp: 90 tablet, Rfl: 3 .  aspirin 81 MG EC tablet, Take 81 mg by mouth once daily., Disp: , Rfl:  .  buPROPion (WELLBUTRIN XL) 150 MG XL tablet, TAKE TWO TABLETS BY MOUTH ONCE DAILY, Disp: 60 tablet, Rfl: 11 .  hydroCHLOROthiazide (HYDRODIURIL) 12.5 MG tablet, TAKE 1 TABLET BY MOUTH ONCE DAILY, Disp: 90 tablet, Rfl: 1 .  metoprolol succinate (TOPROL-XL) 50 MG XL tablet, TAKE ONE TABLET BY MOUTH ONCE DAILY, Disp: 90 tablet, Rfl: 3 .  omeprazole (PRILOSEC) 20 MG DR capsule, TAKE ONE CAPSULE BY MOUTH ONCE DAILY, Disp: 90 capsule, Rfl: 3 .  telmisartan (MICARDIS) 40 MG tablet, TAKE ONE TABLET BY MOUTH ONCE DAILY, Disp: 90 tablet, Rfl: 3 .  tranexamic acid (LYSTEDA) 650 mg tablet, Take 2 tablets (1,300 mg total) by mouth 3 (three) times daily. Take for a maximum of 5 days during monthly menstruation. (Patient not taking: Reported on 02/07/2017 ), Disp: 30 tablet, Rfl: 3  Review of Systems: No SOB, no palpitations or chest pain, no new lower extremity edema, no nausea or vomiting or bowel or bladder complaints. See HPI for gyn specific ROS.   Exam:   BP (!) 125/90   Pulse 75   Wt 90.5 kg (199 lb 9.6 oz)   BMI  34.26 kg/m   General: Patient is well-groomed, well-nourished, appears stated age in no acute distress  HEENT: head is atraumatic and normocephalic, trachea is midline, neck is supple with no palpable nodules  CV: Regular rhythm and normal heart rate, no murmur  Pulm: Clear to auscultation throughout lung fields with no wheezing, crackles, or rhonchi. No increased work of breathing  Abdomen: soft , no mass, non-tender, no  rebound tenderness, no hepatomegaly  Pelvic:  deferred   Impression:   The primary encounter diagnosis was Complex endometrial hyperplasia with atypia. A diagnosis of Menorrhagia with irregular cycle was also pertinent to this visit.    Plan:   Patient returns for a preoperative discussion regarding her plans to proceed with definitive surgical treatment of her atypical hyperplasia by  robotic assisted total laparoscopic hysterectomy with bilateral salpingectomy and sentinel lymph node bx by Dr. Sonia Side. I recommend robotic-assistance because of her hx of EIN.  The patient and I discussed the technical aspects of the procedure including the potential for risks and complications. These include but are not limited to the risk of infection requiring post-operative antibiotics or further procedures. We talked about the risk of injury to adjacent organs including bladder, bowel, ureter, blood vessels or nerves. We talked about the need to convert to an open incision. We talked about the possible need for blood transfusion. We talked aboutpostop complications such asthromboembolic or cardiopulmonary complications. All of her questions were answered.  Her preoperative exam was completed and the appropriate consents were signed. She is scheduled to undergo this procedure in the near future.   No orders of the defined types were placed in this encounter.   Return in about 4 weeks (around 04/04/2017) for Postop check.

## 2017-03-13 ENCOUNTER — Encounter
Admission: RE | Admit: 2017-03-13 | Discharge: 2017-03-13 | Disposition: A | Discharge status: Home / Self Care | Payer: 59 | Source: Ambulatory Visit | Attending: Obstetrics and Gynecology | Admitting: Obstetrics and Gynecology

## 2017-03-13 DIAGNOSIS — Z811 Family history of alcohol abuse and dependence: Secondary | ICD-10-CM | POA: Diagnosis not present

## 2017-03-13 DIAGNOSIS — Z01812 Encounter for preprocedural laboratory examination: Secondary | ICD-10-CM | POA: Diagnosis not present

## 2017-03-13 DIAGNOSIS — F329 Major depressive disorder, single episode, unspecified: Secondary | ICD-10-CM | POA: Diagnosis not present

## 2017-03-13 DIAGNOSIS — M2010 Hallux valgus (acquired), unspecified foot: Secondary | ICD-10-CM | POA: Diagnosis not present

## 2017-03-13 DIAGNOSIS — I10 Essential (primary) hypertension: Secondary | ICD-10-CM | POA: Insufficient documentation

## 2017-03-13 DIAGNOSIS — Z9889 Other specified postprocedural states: Secondary | ICD-10-CM | POA: Diagnosis not present

## 2017-03-13 DIAGNOSIS — Z833 Family history of diabetes mellitus: Secondary | ICD-10-CM | POA: Diagnosis not present

## 2017-03-13 DIAGNOSIS — K5792 Diverticulitis of intestine, part unspecified, without perforation or abscess without bleeding: Secondary | ICD-10-CM | POA: Insufficient documentation

## 2017-03-13 DIAGNOSIS — Z9049 Acquired absence of other specified parts of digestive tract: Secondary | ICD-10-CM | POA: Insufficient documentation

## 2017-03-13 DIAGNOSIS — N920 Excessive and frequent menstruation with regular cycle: Secondary | ICD-10-CM | POA: Insufficient documentation

## 2017-03-13 DIAGNOSIS — K219 Gastro-esophageal reflux disease without esophagitis: Secondary | ICD-10-CM | POA: Diagnosis not present

## 2017-03-13 DIAGNOSIS — K8020 Calculus of gallbladder without cholecystitis without obstruction: Secondary | ICD-10-CM | POA: Insufficient documentation

## 2017-03-13 DIAGNOSIS — N8502 Endometrial intraepithelial neoplasia [EIN]: Secondary | ICD-10-CM | POA: Insufficient documentation

## 2017-03-13 DIAGNOSIS — Z87442 Personal history of urinary calculi: Secondary | ICD-10-CM | POA: Insufficient documentation

## 2017-03-13 DIAGNOSIS — Z825 Family history of asthma and other chronic lower respiratory diseases: Secondary | ICD-10-CM | POA: Insufficient documentation

## 2017-03-13 DIAGNOSIS — Z818 Family history of other mental and behavioral disorders: Secondary | ICD-10-CM | POA: Insufficient documentation

## 2017-03-13 DIAGNOSIS — Z87891 Personal history of nicotine dependence: Secondary | ICD-10-CM | POA: Insufficient documentation

## 2017-03-13 DIAGNOSIS — F419 Anxiety disorder, unspecified: Secondary | ICD-10-CM | POA: Insufficient documentation

## 2017-03-13 DIAGNOSIS — Z8249 Family history of ischemic heart disease and other diseases of the circulatory system: Secondary | ICD-10-CM | POA: Diagnosis not present

## 2017-03-13 HISTORY — DX: Depression, unspecified: F32.A

## 2017-03-13 HISTORY — DX: Major depressive disorder, single episode, unspecified: F32.9

## 2017-03-13 LAB — BASIC METABOLIC PANEL
ANION GAP: 10 (ref 5–15)
BUN: 10 mg/dL (ref 6–20)
CALCIUM: 9.5 mg/dL (ref 8.9–10.3)
CO2: 26 mmol/L (ref 22–32)
Chloride: 104 mmol/L (ref 101–111)
Creatinine, Ser: 0.63 mg/dL (ref 0.44–1.00)
GFR calc Af Amer: >60 mL/min (ref >60)
Glucose, Bld: 89 mg/dL (ref 65–99)
Potassium: 3.1 mmol/L — ABNORMAL LOW (ref 3.5–5.1)
Sodium: 140 mmol/L (ref 135–145)

## 2017-03-13 LAB — CBC
HCT: 38.9 % (ref 35.0–47.0)
HEMOGLOBIN: 13.4 g/dL (ref 12.0–16.0)
MCH: 31 pg (ref 26.0–34.0)
MCHC: 34.4 g/dL (ref 32.0–36.0)
MCV: 90.1 fL (ref 80.0–100.0)
PLATELETS: 380 10*3/uL (ref 150–440)
RBC: 4.32 MIL/uL (ref 3.80–5.20)
RDW: 13.3 % (ref 11.5–14.5)
WBC: 9.9 10*3/uL (ref 3.6–11.0)

## 2017-03-13 NOTE — Pre-Procedure Instructions (Signed)
Abnormal K faxed to Dr Dalbert Garnet office.

## 2017-03-13 NOTE — Patient Instructions (Addendum)
Your procedure is scheduled on: Wednesday 03/29/17 Report to DAY SURGERY. 2ND FLOOR MEDICAL MALL ENTRANCE. To find out your arrival time please call [phone] between 1PM - 3PM on Tuesday 03/28/17.  Remember: Instructions that are not followed completely may result in serious medical risk, up to and including death, or upon the discretion of your surgeon and anesthesiologist your surgery may need to be rescheduled.    __X__ 1. Do not eat anything after midnight the night before your    procedure.  No gum chewing or hard candies.  You may drink clear   liquids up to 2 hours before you are scheduled to arrive at the   hospital for your procedure. Do not drink clear liquids within 2   hours of scheduled arrival to the hospital as this may lead to your   procedure being delayed or rescheduled.       Clear liquids include:   Water or Apple juice without pulp   Clear carbohydrate beverage such as Clearfast or Gatorade   Black coffee or Clear Tea 9no milk, no creamer, do not add anything   to the coffee or tea)    Diabetics should only drink water   __X__ 2. No Alcohol for 24 hours before or after surgery.   ____ 3. Bring all medications with you on the day of surgery if instructed.    __X__ 4. Notify your doctor if there is any change in your medical condition     (cold, fever, infections).             ___X__5. No smoking within 24 hours of your surgery.     Do not wear jewelry, make-up, hairpins, clips or nail polish.  Do not wear lotions, powders, or perfumes.   Do not shave 48 hours prior to surgery. Men may shave face and neck.  Do not bring valuables to the hospital.    [facility] is not responsible for any belongings or valuables.               Contacts, dentures or bridgework may not be worn into surgery.  Leave your suitcase in the car. After surgery it may be brought to your room.  For patients admitted to the hospital, discharge time is determined by your                 treatment team.   Patients discharged the day of surgery will not be allowed to drive home.   Please read over the following fact sheets that you were given:   MRSA Information   __X__ Take these medicines the morning of surgery with A SIP OF WATER:    1. AMLODIPINE  2. WELLBUTRIN  3. OMEPRAZOLE  4. TELMISARTAN  5. METOPROLOL  6.  ____ Fleet Enema (as directed)   __X__ Use CHG Soap as directed  ____ Use inhalers on the day of surgery  ____ Stop metformin 2 days prior to surgery    ____ Take 1/2 of usual insulin dose the night before surgery and none on the morning of surgery.   __X__ Stop Coumadin/Plavix/aspirin on LAST DOSE 03/21/17  __X__ Stop Anti-inflammatories such as Advil, Aleve, Ibuprofen, Motrin, Naproxen, Naprosyn, Goodies,powder, or aspirin products.  OK to take Tylenol.   ____ Stop supplements until after surgery.    ____ Bring C-Pap to the hospital.

## 2017-03-28 MED ORDER — CEFAZOLIN SODIUM-DEXTROSE 2-4 GM/100ML-% IV SOLN
2.0000 g | INTRAVENOUS | Status: AC
  Administered 2017-03-29: 2 g via INTRAVENOUS

## 2017-03-29 ENCOUNTER — Ambulatory Visit
Admission: RE | Admit: 2017-03-29 | Discharge: 2017-03-29 | Disposition: A | Discharge status: Home / Self Care | Payer: 59 | Source: Ambulatory Visit | Attending: Obstetrics and Gynecology | Admitting: Obstetrics and Gynecology

## 2017-03-29 ENCOUNTER — Ambulatory Visit: Payer: 59 | Admitting: Anesthesiology

## 2017-03-29 ENCOUNTER — Encounter
Admission: RE | Disposition: A | Discharge status: Home / Self Care | Payer: Self-pay | Source: Ambulatory Visit | Attending: Obstetrics and Gynecology

## 2017-03-29 DIAGNOSIS — Z881 Allergy status to other antibiotic agents status: Secondary | ICD-10-CM | POA: Insufficient documentation

## 2017-03-29 DIAGNOSIS — Z87891 Personal history of nicotine dependence: Secondary | ICD-10-CM | POA: Insufficient documentation

## 2017-03-29 DIAGNOSIS — Z79899 Other long term (current) drug therapy: Secondary | ICD-10-CM | POA: Diagnosis not present

## 2017-03-29 DIAGNOSIS — N920 Excessive and frequent menstruation with regular cycle: Secondary | ICD-10-CM | POA: Insufficient documentation

## 2017-03-29 DIAGNOSIS — F419 Anxiety disorder, unspecified: Secondary | ICD-10-CM | POA: Insufficient documentation

## 2017-03-29 DIAGNOSIS — Z7982 Long term (current) use of aspirin: Secondary | ICD-10-CM | POA: Diagnosis not present

## 2017-03-29 DIAGNOSIS — N8502 Endometrial intraepithelial neoplasia [EIN]: Secondary | ICD-10-CM | POA: Diagnosis not present

## 2017-03-29 DIAGNOSIS — N838 Other noninflammatory disorders of ovary, fallopian tube and broad ligament: Secondary | ICD-10-CM | POA: Insufficient documentation

## 2017-03-29 HISTORY — PX: SENTINEL NODE BIOPSY: SHX6608

## 2017-03-29 HISTORY — PX: LAPAROSCOPIC LYSIS OF ADHESIONS: SHX5905

## 2017-03-29 HISTORY — PX: TOTAL LAPAROSCOPIC HYSTERECTOMY WITH SALPINGECTOMY: SHX6742

## 2017-03-29 LAB — TYPE AND SCREEN
ABO/RH(D): O POS
Antibody Screen: NEGATIVE

## 2017-03-29 LAB — POCT I-STAT 4, (NA,K, GLUC, HGB,HCT)
GLUCOSE: 95 mg/dL (ref 65–99)
HEMATOCRIT: 36 % (ref 36.0–46.0)
HEMOGLOBIN: 12.2 g/dL (ref 12.0–15.0)
Potassium: 3.7 mmol/L (ref 3.5–5.1)
Sodium: 139 mmol/L (ref 135–145)

## 2017-03-29 LAB — POCT PREGNANCY, URINE: PREG TEST UR: NEGATIVE

## 2017-03-29 SURGERY — HYSTERECTOMY, TOTAL, LAPAROSCOPIC, WITH SALPINGECTOMY
Anesthesia: General

## 2017-03-29 MED ORDER — KETOROLAC TROMETHAMINE 30 MG/ML IJ SOLN
INTRAMUSCULAR | Status: DC | PRN
  Administered 2017-03-29: 30 mg via INTRAVENOUS

## 2017-03-29 MED ORDER — PROPOFOL 10 MG/ML IV BOLUS
INTRAVENOUS | Status: DC | PRN
  Administered 2017-03-29: 150 mg via INTRAVENOUS

## 2017-03-29 MED ORDER — ACETAMINOPHEN 500 MG PO TABS
1000.0000 mg | ORAL_TABLET | ORAL | Status: AC
  Administered 2017-03-29: 1000 mg via ORAL

## 2017-03-29 MED ORDER — LIDOCAINE HCL (CARDIAC) 20 MG/ML IV SOLN
INTRAVENOUS | Status: DC | PRN
  Administered 2017-03-29: 50 mg via INTRAVENOUS

## 2017-03-29 MED ORDER — GABAPENTIN 300 MG PO CAPS
900.0000 mg | ORAL_CAPSULE | ORAL | Status: AC
  Administered 2017-03-29: 900 mg via ORAL

## 2017-03-29 MED ORDER — ACETAMINOPHEN 500 MG PO TABS
ORAL_TABLET | ORAL | Status: AC
  Administered 2017-03-29: 1000 mg via ORAL
  Filled 2017-03-29: qty 2

## 2017-03-29 MED ORDER — FENTANYL CITRATE (PF) 100 MCG/2ML IJ SOLN
25.0000 ug | INTRAMUSCULAR | Status: DC | PRN
  Administered 2017-03-29 (×4): 25 ug via INTRAVENOUS

## 2017-03-29 MED ORDER — MIDAZOLAM HCL 2 MG/2ML IJ SOLN
INTRAMUSCULAR | Status: DC | PRN
  Administered 2017-03-29: 2 mg via INTRAVENOUS

## 2017-03-29 MED ORDER — GABAPENTIN 400 MG PO CAPS
ORAL_CAPSULE | ORAL | Status: AC
  Administered 2017-03-29: 900 mg via ORAL
  Filled 2017-03-29: qty 1

## 2017-03-29 MED ORDER — ROCURONIUM BROMIDE 50 MG/5ML IV SOLN
INTRAVENOUS | Status: AC
  Filled 2017-03-29: qty 1

## 2017-03-29 MED ORDER — INDOCYANINE GREEN 25 MG IV SOLR
INTRAVENOUS | Status: DC | PRN
  Administered 2017-03-29: 25 mg

## 2017-03-29 MED ORDER — ONDANSETRON HCL 4 MG/2ML IJ SOLN
INTRAMUSCULAR | Status: AC
  Filled 2017-03-29: qty 2

## 2017-03-29 MED ORDER — DEXAMETHASONE SODIUM PHOSPHATE 10 MG/ML IJ SOLN
INTRAMUSCULAR | Status: DC | PRN
  Administered 2017-03-29: 10 mg via INTRAVENOUS

## 2017-03-29 MED ORDER — DOCUSATE SODIUM 100 MG PO CAPS: 100.0000 mg | ORAL_CAPSULE | Freq: Two times a day (BID) | ORAL | 0 refills | Status: AC

## 2017-03-29 MED ORDER — EPHEDRINE SULFATE 50 MG/ML IJ SOLN
INTRAMUSCULAR | Status: DC | PRN
  Administered 2017-03-29 (×4): 10 mg via INTRAVENOUS

## 2017-03-29 MED ORDER — IBUPROFEN 800 MG PO TABS: 800.0000 mg | ORAL_TABLET | Freq: Three times a day (TID) | ORAL | 1 refills | Status: AC

## 2017-03-29 MED ORDER — CELECOXIB 200 MG PO CAPS
400.0000 mg | ORAL_CAPSULE | ORAL | Status: AC
  Administered 2017-03-29: 400 mg via ORAL

## 2017-03-29 MED ORDER — GABAPENTIN 400 MG PO CAPS
ORAL_CAPSULE | ORAL | Status: AC
  Filled 2017-03-29: qty 1

## 2017-03-29 MED ORDER — ACETAMINOPHEN 500 MG PO TABS
ORAL_TABLET | ORAL | Status: AC
  Filled 2017-03-29: qty 2

## 2017-03-29 MED ORDER — LACTATED RINGERS IV SOLN
INTRAVENOUS | Status: DC
  Administered 2017-03-29 (×2): via INTRAVENOUS

## 2017-03-29 MED ORDER — DEXAMETHASONE SODIUM PHOSPHATE 10 MG/ML IJ SOLN
INTRAMUSCULAR | Status: AC
  Filled 2017-03-29: qty 1

## 2017-03-29 MED ORDER — ACETAMINOPHEN 500 MG PO TABS: 1000.0000 mg | ORAL_TABLET | Freq: Four times a day (QID) | ORAL | 0 refills | Status: AC

## 2017-03-29 MED ORDER — ROCURONIUM BROMIDE 100 MG/10ML IV SOLN
INTRAVENOUS | Status: DC | PRN
  Administered 2017-03-29: 50 mg via INTRAVENOUS
  Administered 2017-03-29: 20 mg via INTRAVENOUS

## 2017-03-29 MED ORDER — PHENYLEPHRINE HCL 10 MG/ML IJ SOLN
INTRAMUSCULAR | Status: DC | PRN
  Administered 2017-03-29 (×3): 100 ug via INTRAVENOUS
  Administered 2017-03-29: 50 ug via INTRAVENOUS
  Administered 2017-03-29: 200 ug via INTRAVENOUS
  Administered 2017-03-29: 50 ug via INTRAVENOUS
  Administered 2017-03-29: 100 ug via INTRAVENOUS
  Administered 2017-03-29 (×2): 200 ug via INTRAVENOUS
  Administered 2017-03-29 (×2): 100 ug via INTRAVENOUS

## 2017-03-29 MED ORDER — ONDANSETRON HCL 4 MG/2ML IJ SOLN: 4.0000 mg | Freq: Once | INTRAMUSCULAR | Status: DC | PRN

## 2017-03-29 MED ORDER — CEFAZOLIN SODIUM-DEXTROSE 2-4 GM/100ML-% IV SOLN
INTRAVENOUS | Status: AC
  Filled 2017-03-29: qty 100

## 2017-03-29 MED ORDER — INDOCYANINE GREEN 25 MG IV SOLR
INTRAVENOUS | Status: AC
  Filled 2017-03-29: qty 25

## 2017-03-29 MED ORDER — LIDOCAINE HCL (PF) 2 % IJ SOLN
INTRAMUSCULAR | Status: AC
  Filled 2017-03-29: qty 2

## 2017-03-29 MED ORDER — ONDANSETRON HCL 4 MG/2ML IJ SOLN
INTRAMUSCULAR | Status: DC | PRN
  Administered 2017-03-29: 4 mg via INTRAVENOUS

## 2017-03-29 MED ORDER — CELECOXIB 200 MG PO CAPS
ORAL_CAPSULE | ORAL | Status: AC
  Administered 2017-03-29: 400 mg via ORAL
  Filled 2017-03-29: qty 2

## 2017-03-29 MED ORDER — BUPIVACAINE HCL 0.5 % IJ SOLN
INTRAMUSCULAR | Status: DC | PRN
  Administered 2017-03-29: 7 mL

## 2017-03-29 MED ORDER — SUGAMMADEX SODIUM 200 MG/2ML IV SOLN
INTRAVENOUS | Status: AC
  Filled 2017-03-29: qty 2

## 2017-03-29 MED ORDER — PHENYLEPHRINE HCL 10 MG/ML IJ SOLN
INTRAMUSCULAR | Status: AC
  Filled 2017-03-29: qty 1

## 2017-03-29 MED ORDER — OXYCODONE HCL 5 MG PO CAPS: 5.0000 mg | ORAL_CAPSULE | Freq: Four times a day (QID) | ORAL | 0 refills | Status: AC | PRN

## 2017-03-29 MED ORDER — KETOROLAC TROMETHAMINE 30 MG/ML IJ SOLN
INTRAMUSCULAR | Status: AC
  Filled 2017-03-29: qty 1

## 2017-03-29 MED ORDER — SEVOFLURANE IN SOLN
RESPIRATORY_TRACT | Status: AC
  Filled 2017-03-29: qty 250

## 2017-03-29 MED ORDER — FENTANYL CITRATE (PF) 100 MCG/2ML IJ SOLN
INTRAMUSCULAR | Status: AC
  Administered 2017-03-29: 25 ug via INTRAVENOUS
  Filled 2017-03-29: qty 2

## 2017-03-29 MED ORDER — EPHEDRINE SULFATE 50 MG/ML IJ SOLN
INTRAMUSCULAR | Status: AC
  Filled 2017-03-29: qty 1

## 2017-03-29 MED ORDER — GABAPENTIN 800 MG PO TABS: 800.0000 mg | ORAL_TABLET | Freq: Every day | ORAL | 0 refills | Status: AC

## 2017-03-29 MED ORDER — FENTANYL CITRATE (PF) 100 MCG/2ML IJ SOLN
INTRAMUSCULAR | Status: DC | PRN
  Administered 2017-03-29 (×2): 50 ug via INTRAVENOUS
  Administered 2017-03-29: 100 ug via INTRAVENOUS
  Administered 2017-03-29: 50 ug via INTRAVENOUS

## 2017-03-29 MED ORDER — FENTANYL CITRATE (PF) 250 MCG/5ML IJ SOLN
INTRAMUSCULAR | Status: AC
  Filled 2017-03-29: qty 5

## 2017-03-29 MED ORDER — MIDAZOLAM HCL 2 MG/2ML IJ SOLN
INTRAMUSCULAR | Status: AC
Start: 2017-03-29 — End: 2017-03-29
  Filled 2017-03-29: qty 2

## 2017-03-29 MED ORDER — SUGAMMADEX SODIUM 200 MG/2ML IV SOLN
INTRAVENOUS | Status: DC | PRN
  Administered 2017-03-29: 180 mg via INTRAVENOUS

## 2017-03-29 SURGICAL SUPPLY — 71 items
ADH SKN CLS APL DERMABOND .7 (GAUZE/BANDAGES/DRESSINGS) ×3
BAG SPEC RTRVL LRG 6X4 10 (ENDOMECHANICALS)
BAG URO DRAIN 2000ML W/SPOUT (MISCELLANEOUS) ×5 IMPLANT
BLADE SURG SZ11 CARB STEEL (BLADE) ×5 IMPLANT
CATH FOLEY 2WAY  5CC 16FR (CATHETERS) ×2
CATH FOLEY 2WAY 5CC 16FR (CATHETERS) ×3
CATH ROBINSON RED A/P 16FR (CATHETERS) ×5 IMPLANT
CATH URTH 16FR FL 2W BLN LF (CATHETERS) ×3 IMPLANT
CHLORAPREP W/TINT 26ML (MISCELLANEOUS) ×5 IMPLANT
CLOSURE WOUND 1/4X4 (GAUZE/BANDAGES/DRESSINGS) ×1
CORD MONOPOLAR M/FML 12FT (MISCELLANEOUS) ×5 IMPLANT
COUNTER NEEDLE 20/40 LG (NEEDLE) ×5 IMPLANT
COVER LIGHT HANDLE STERIS (MISCELLANEOUS) ×10 IMPLANT
DEFOGGER SCOPE WARMER CLEARIFY (MISCELLANEOUS) ×5 IMPLANT
DERMABOND ADVANCED (GAUZE/BANDAGES/DRESSINGS) ×2
DERMABOND ADVANCED .7 DNX12 (GAUZE/BANDAGES/DRESSINGS) ×3 IMPLANT
DRAPE POUCH INSTRU U-SHP 10X18 (DRAPES) ×2 IMPLANT
DRSG TEGADERM 2-3/8X2-3/4 SM (GAUZE/BANDAGES/DRESSINGS) ×9 IMPLANT
GAUZE SPONGE NON-WVN 2X2 STRL (MISCELLANEOUS) ×6 IMPLANT
GLOVE BIO SURGEON STRL SZ7 (GLOVE) ×18 IMPLANT
GLOVE INDICATOR 7.5 STRL GRN (GLOVE) ×11 IMPLANT
GOWN STRL REUS W/ TWL LRG LVL3 (GOWN DISPOSABLE) ×6 IMPLANT
GOWN STRL REUS W/ TWL XL LVL3 (GOWN DISPOSABLE) ×3 IMPLANT
GOWN STRL REUS W/TWL LRG LVL3 (GOWN DISPOSABLE) ×10
GOWN STRL REUS W/TWL XL LVL3 (GOWN DISPOSABLE) ×5
GRASPER SUT TROCAR 14GX15 (MISCELLANEOUS) ×2 IMPLANT
IRRIGATION STRYKERFLOW (MISCELLANEOUS) ×3 IMPLANT
IRRIGATOR STRYKERFLOW (MISCELLANEOUS)
IV NS 1000ML (IV SOLUTION)
IV NS 1000ML BAXH (IV SOLUTION) ×3 IMPLANT
KIT PINK PAD W/HEAD ARE REST (MISCELLANEOUS) ×5
KIT PINK PAD W/HEAD ARM REST (MISCELLANEOUS) ×3 IMPLANT
KIT RM TURNOVER CYSTO AR (KITS) ×5 IMPLANT
LABEL OR SOLS (LABEL) ×5 IMPLANT
LIGASURE BLUNT 5MM 37CM (INSTRUMENTS) ×5 IMPLANT
LIGASURE LAP MARYLAND 5MM 37CM (ELECTROSURGICAL) ×3 IMPLANT
MANIPULATOR VCARE LG CRV RETR (MISCELLANEOUS) IMPLANT
MANIPULATOR VCARE SML CRV RETR (MISCELLANEOUS) ×3 IMPLANT
MANIPULATOR VCARE STD CRV RETR (MISCELLANEOUS) ×2 IMPLANT
NDL INSUFF ACCESS 14 VERSASTEP (NEEDLE) ×2 IMPLANT
NEEDLE VERESS 14GA 120MM (NEEDLE) ×3 IMPLANT
NS IRRIG 500ML POUR BTL (IV SOLUTION) ×5 IMPLANT
OCCLUDER COLPOPNEUMO (BALLOONS) ×5 IMPLANT
PACK GYN LAPAROSCOPIC (MISCELLANEOUS) ×5 IMPLANT
PAD OB MATERNITY 4.3X12.25 (PERSONAL CARE ITEMS) ×5 IMPLANT
PAD PREP 24X41 OB/GYN DISP (PERSONAL CARE ITEMS) ×5 IMPLANT
POUCH SPECIMEN RETRIEVAL 10MM (ENDOMECHANICALS) ×3 IMPLANT
SCISSORS METZENBAUM CVD 33 (INSTRUMENTS) ×5 IMPLANT
SET CYSTO W/LG BORE CLAMP LF (SET/KITS/TRAYS/PACK) IMPLANT
SHEARS ENDO 5MM 31CM (CUTTER) ×2 IMPLANT
SLEEVE ENDOPATH XCEL 5M (ENDOMECHANICALS) ×3 IMPLANT
SPONGE VERSALON 2X2 STRL (MISCELLANEOUS)
STRIP CLOSURE SKIN 1/4X4 (GAUZE/BANDAGES/DRESSINGS) ×4 IMPLANT
SUT MNCRL 4-0 (SUTURE) ×25
SUT MNCRL 4-0 27XMFL (SUTURE) ×3
SUT MNCRL 4-018XMFL (SUTURE) ×12
SUT MNCRL AB 4-0 PS2 18 (SUTURE) ×9 IMPLANT
SUT VIC AB 0 CT1 36 (SUTURE) ×7 IMPLANT
SUT VIC AB 2-0 UR6 27 (SUTURE) ×5 IMPLANT
SUT VIC AB 4-0 SH 27 (SUTURE)
SUT VIC AB 4-0 SH 27XANBCTRL (SUTURE) ×3 IMPLANT
SUTURE MNCRL 4-0 27XMF (SUTURE) ×3 IMPLANT
SUTURE MNCRL 4-018XMF (SUTURE) IMPLANT
SWABSTK COMLB BENZOIN TINCTURE (MISCELLANEOUS) ×5 IMPLANT
SYR 50ML LL SCALE MARK (SYRINGE) ×5 IMPLANT
SYRINGE 10CC LL (SYRINGE) ×5 IMPLANT
TROCAR BLUNT TIP 12MM OMST12BT (TROCAR) ×2 IMPLANT
TROCAR VERSASTEP PLUS 12MM (TROCAR) ×2 IMPLANT
TROCAR XCEL NON-BLD 5MMX100MML (ENDOMECHANICALS) ×6 IMPLANT
TUBING INSUF HEATED (TUBING) ×5 IMPLANT
TUBING INSUFFLATOR HI FLOW (MISCELLANEOUS) ×5 IMPLANT

## 2017-03-29 NOTE — Anesthesia Preprocedure Evaluation (Signed)
Anesthesia Evaluation  Patient identified by MRN, date of birth, ID band Patient awake    Reviewed: Allergy & Precautions, H&P , NPO status , Patient's Chart, lab work & pertinent test results, reviewed documented beta blocker date and time   History of Anesthesia Complications Negative for: history of anesthetic complications  Airway Mallampati: I  TM Distance: >3 FB Neck ROM: full    Dental  (+) Teeth Intact Permanent retainer:   Pulmonary neg pulmonary ROS, former smoker,           Cardiovascular Exercise Tolerance: Good hypertension, (-) angina(-) CAD, (-) Past MI, (-) Cardiac Stents and (-) CABG + dysrhythmias (-) Valvular Problems/Murmurs     Neuro/Psych  Headaches, PSYCHIATRIC DISORDERS Depression negative neurological ROS  negative psych ROS   GI/Hepatic Neg liver ROS, hiatal hernia, GERD  ,  Endo/Other  negative endocrine ROS  Renal/GU Renal disease (stones)  negative genitourinary   Musculoskeletal   Abdominal   Peds negative pediatric ROS (+)  Hematology negative hematology ROS (+)   Anesthesia Other Findings Past Medical History: No date: GERD (gastroesophageal reflux disease) No date: Headache     Comment: H/O MIGRAINES No date: History of hiatal hernia No date: History of kidney stones No date: Hypertension No date: Right bundle branch block   Reproductive/Obstetrics negative OB ROS                             Anesthesia Physical  Anesthesia Plan  ASA: II  Anesthesia Plan: General   Post-op Pain Management:    Induction: Intravenous  PONV Risk Score and Plan: 3 and Ondansetron and Dexamethasone  Airway Management Planned: Oral ETT  Additional Equipment:   Intra-op Plan:   Post-operative Plan: Extubation in OR  Informed Consent: I have reviewed the patients History and Physical, chart, labs and discussed the procedure including the risks, benefits and  alternatives for the proposed anesthesia with the patient or authorized representative who has indicated his/her understanding and acceptance.   Dental Advisory Given  Plan Discussed with: Anesthesiologist, CRNA and Surgeon  Anesthesia Plan Comments:         Anesthesia Quick Evaluation

## 2017-03-29 NOTE — Discharge Instructions (Signed)
Discharge instructions after  ° total laparoscopic hysterectomy ° °Signs and Symptoms to Report °Call our office at [phone] if you have any of the following. ° °• Fever over 100.4 degrees or higher °• Severe stomach pain not relieved with pain medications °• Bright red bleeding that’s heavier than a period that does not slow with rest °• To go the bathroom a lot (frequency), you can’t hold your urine (urgency), or it hurts when you empty your bladder (urinate) °• Chest pain °• Shortness of breath °• Pain in the calves of your legs °• Severe nausea and vomiting not relieved with anti-nausea medications °• Signs of infection around your wounds, such as redness, hot to touch, swelling, green/yellow drainage (like pus), bad smelling discharge °• Any concerns ° °What You Can Expect after Surgery °• You may see some pink tinged, bloody fluid and bruising around the wound. This is normal. °• You may notice shoulder and neck pain. This is caused by the gas used during surgery to expand your abdomen so your surgeon could get to the uterus easier. °• You may have a sore throat because of the tube in your mouth during general anesthesia. This will go away in 2 to 3 days. °• You may have some stomach cramps. °• You may notice spotting on your panties. °• You may have pain around the incision sites. ° ° °Activities after Your Discharge °Follow these guidelines to help speed your recovery at home: °• Do the coughing and deep breathing as you did in the hospital for 2 weeks. Use the small blue breathing device, called the incentive spirometer for 2 weeks. °• Don’t drive if you are in pain or taking narcotic pain medicine. You may drive when you can safely slam on the brakes, turn the wheel forcefully, and rotate your torso comfortably. This is typically 1-2 weeks. Practice in a parking lot or side street prior to attempting to drive regularly.  °• Ask others to help with household chores for 4 weeks. °• Do not lift anything  heavier that 10 pounds for 4-6 weeks. This includes pets, children, and groceries. °• Don’t do strenuous activities, exercises, or sports like vacuuming, tennis, squash, etc. until your doctor says it is safe to do so. °---Maintain pelvic rest for 8 weeks. This means nothing in the vagina or rectum at all (no douching, tampons, intercourse) for 8 weeks.  °• Walk as you feel able. Rest often since it may take two or three weeks for your energy level to return to normal.  °• You may climb stairs °• Avoid constipation: °  -Eat fruits, vegetables, and whole grains. Eat small meals as your appetite will take time to return to normal. °  -Drink 6 to 8 glasses of water each day unless your doctor has told you to limit your fluids. °  -Use a laxative or stool softener as needed if constipation becomes a problem. You may take Miralax, metamucil, Citrucil, Colace, Senekot, FiberCon, etc. If this does not relieve the constipation, try two tablespoons of Milk Of Magnesia every 8 hours until your bowels move.  °• You may shower. Gently wash the wounds with a mild soap and water. Pat dry. °• Do not get in a hot tub, swimming pool, etc. for 6 weeks. °• Do not use lotions, oils, powders on the wounds. °• Do not douche, use tampons, or have sex until your doctor says it is okay. °• Take your pain medicine when you need it. The medicine may   not work as well if the pain is bad. ° °Take the medicines you were taking before surgery. Other medications you will need are pain medications and possibly constipation and nausea medications (Zofran). AMBULATORY SURGERY  °DISCHARGE INSTRUCTIONS ° ° °1) The drugs that you were given will stay in your system until tomorrow so for the next 24 hours you should not: ° °A) Drive an automobile °B) Make any legal decisions °C) Drink any alcoholic beverage ° ° °2) You may resume regular meals tomorrow.  Today it is better to start with liquids and gradually work up to solid foods. ° °You may eat anything  you prefer, but it is better to start with liquids, then soup and crackers, and gradually work up to solid foods. ° ° °3) Please notify your doctor immediately if you have any unusual bleeding, trouble breathing, redness and pain at the surgery site, drainage, fever, or pain not relieved by medication. ° ° ° °4) Additional Instructions: ° ° ° ° ° ° ° °Please contact your physician with any problems or Same Day Surgery at [phone], Monday through Friday 6 am to 4 pm, or [place] [facility] [place] [facility] number at [phone]. °

## 2017-03-29 NOTE — Transfer of Care (Signed)
Immediate Anesthesia Transfer of Care Note  Patient: [name]  Procedure(s) Performed: Procedure(s): HYSTERECTOMY TOTAL LAPAROSCOPIC WITH SALPINGECTOMY (Bilateral) LAPAROSCOPIC BILATERAL SALPINGECTOMY (Bilateral) SENTINEL NODE BIOPSY/ MAPPING (N/A)  Patient Location: PACU  Anesthesia Type:General  Level of Consciousness: awake, alert  and oriented  Airway & Oxygen Therapy: Patient Spontanous Breathing and Patient connected to face mask oxygen  Post-op Assessment: Report given to RN and Post -op Vital signs reviewed and stable  Post vital signs: Reviewed and stable  Last Vitals:  Vitals:   03/29/17 0620 03/29/17 1027  BP: 128/78 (!) 97/58  Pulse: 79 82  Resp: 17 19  Temp: 36.7 C 36.7 C  SpO2: 100% 98%    Last Pain:  Vitals:   03/29/17 0620  TempSrc: Oral         Complications: No apparent anesthesia complications

## 2017-03-29 NOTE — Op Note (Signed)
Operative Note   03/29/2017 10:32 AM  PRE-OP DIAGNOSIS: Endometrial Intraepithelial Neoplasia    POST-OP DIAGNOSIS: same  SURGEON: Surgeon(s) and Role: Panel 1:    Christeen Douglas* Beasley, Bethany, MD - Primary Panel 2:    Leida Lauth* Erdem Naas, MD - Assistant  ANESTHESIA: General ET  PROCEDURE: HYSTERECTOMY TOTAL LAPAROSCOPIC WITH BILATERAL SALPINGECTOMY, SENTINEL NODE MAPPING   ESTIMATED BLOOD LOSS: Minimal  DRAINS: none  TOTAL IV FLUIDS: per Anesthesia  SPECIMENS: tubes and utrerus  COMPLICATIONS: none  DISPOSITION: PACU - hemodynamically stable.  CONDITION: stable  INDICATIONS: EIN on endometrial biopsy and does not desire fertility.   FINDINGS: Normal uterus, tubes and ovaries s/p tubal ligation.  Bilateral SLNs observed but not removed because no cancer seen on frozen section.  PROCEDURE IN DETAIL: After informed consent was obtained, the patient was taken to the operating room where anesthesia was obtained without difficulty. The patient was positioned in the dorsal lithotomy position in [place] stirrups and her arms were carefully tucked at her sides and the usual precautions were taken.  She was prepped and draped in normal sterile fashion.  Time-out was performed and a Foley catheter was placed into the bladder and the cervix was infiltrated with 4 ml of ICG at 3 an 9 o'clock both superficial and deep injections. A standard VCare uterine manipulator was then placed in the uterus without incident.    An open Hasson technique was used to place an infraumbilical 10-mm baloon trocar under direct visualization. The laparoscope was introduced and CO2 gas was infused for pneumoperitoneum to a pressure of 15 mm Hg.  Right and left lateral 5-mm ports and a 5-12 mm suprapubic port were placed under direct visualization of the laparoscope using an EndoStep technique.  Cytologic washings were obtained.  The patient was placed in Trendelenburg and the bowel was displaced up into the upper abdomen.   Round ligaments were divided on each side with the EndoShears and the retroperitoneal space was opened bilaterally.  The ureters were identified and preserved.  At this point the retroperitoneal spaces were developed and the lymphatic channels were mapped to each side demonstrating the SLN in the right common iliac and left external iliac areas.  The nodes were not removed, since frozen section showed no evidence of cancer. The fallopian tubes were removed from the adenxa with the ligasure with attention to preserving the infundibulopelvic ligaments and ovaries.  A bladder flap was created and the bladder was dissected down off the lower uterine segment and cervix using endoshears and electrocautery.  The uterine arteries were skeletonized bilaterally, sealed and divided with the LigaSure device.  A colpotomy was performed circumferentially along the V-Care ring with electrocautery and the cervix was incised from the vagina and the specimen was removed through the vagina.  A pneumo balloon was placed in the vagina and the vaginal cuff was then closed in a running continuous fashion using the EndoStitch technique with 0 V-Lock suture with careful attention to include the vaginal cuff angles and the vaginal mucosa within the closure.  Intraoperative pathologic evaluation revealed no cancer so SLNs were not removed.   Hemostasis was observed. The intraperitoneal pressure was dropped, and all planes of dissection, vascular pedicles and the vaginal cuff were found to be hemostatic.  The suprapubic trocar was removed and the fascia was closed with 0 Vicryl suture using the Endoclose technique. The lateral trocars were removed under visualization.   Before the umbilical trocar was removed the CO2 gas was released.  The fascia  there was closed with 0 Vicryl suture in interrupted figure of eight technique.   The skin incisions were closed with subcuticular stitches and glue.  The patient tolerated the procedure well.   Sponge, lap and needle counts were correct x2.  The patient was taken to recovery room in excellent condition.  Antibiotics: 2 gm Ancef  VTE prophylaxis: was ordered perioperatively.  Leida Lauth, MD

## 2017-03-29 NOTE — Anesthesia Post-op Follow-up Note (Signed)
Anesthesia QCDR form completed.        

## 2017-03-29 NOTE — Anesthesia Procedure Notes (Signed)
Procedure Name: Intubation Date/Time: 03/29/2017 8:04 AM Performed by: Jonna Clark Pre-anesthesia Checklist: Patient identified, Patient being monitored, Timeout performed, Emergency Drugs available and Suction available Patient Re-evaluated:Patient Re-evaluated prior to induction Oxygen Delivery Method: Circle system utilized Preoxygenation: Pre-oxygenation with 100% oxygen Induction Type: IV induction Ventilation: Mask ventilation without difficulty Laryngoscope Size: Mac and 3 Grade View: Grade I Tube type: Oral Tube size: 7.0 mm Number of attempts: 1 Placement Confirmation: ETT inserted through vocal cords under direct vision,  positive ETCO2 and breath sounds checked- equal and bilateral Secured at: 21 cm Tube secured with: Tape Dental Injury: Teeth and Oropharynx as per pre-operative assessment

## 2017-03-29 NOTE — Interval H&P Note (Signed)
History and Physical Interval Note:  03/29/2017 7:40 AM  [name]  has presented today for surgery, with the diagnosis of Complex Endometrial Hyperplasia  The various methods of treatment have been discussed with the patient and family. After consideration of risks, benefits and other options for treatment, the patient has consented to  Procedure(s): HYSTERECTOMY TOTAL LAPAROSCOPIC WITH SALPINGECTOMY (Bilateral) LAPAROSCOPIC BILATERAL SALPINGECTOMY (Bilateral) SENTINEL NODE BIOPSY/ MAPPING (N/A) as a surgical intervention .  The patient's history has been reviewed, patient examined, no change in status, stable for surgery.  I have reviewed the patient's chart and labs.  Questions were answered to the patient's satisfaction.     Christeen DouglasBethany Shanitha Twining

## 2017-03-29 NOTE — Anesthesia Postprocedure Evaluation (Signed)
Anesthesia Post Note  Patient: [name]  Procedure(s) Performed: Procedure(s) (LRB): HYSTERECTOMY TOTAL LAPAROSCOPIC WITH SALPINGECTOMY (Bilateral) SENTINEL NODE INJECTION (N/A) LAPAROSCOPIC LYSIS OF ADHESIONS  Patient location during evaluation: PACU Anesthesia Type: General Level of consciousness: awake and alert and oriented Pain management: pain level controlled Vital Signs Assessment: post-procedure vital signs reviewed and stable Respiratory status: spontaneous breathing Cardiovascular status: blood pressure returned to baseline Anesthetic complications: no     Last Vitals:  Vitals:   03/29/17 1143 03/29/17 1213  BP: 104/66 107/70  Pulse: 94 86  Resp: 16 16  Temp: 36.5 C   SpO2: 95% 98%    Last Pain:  Vitals:   03/29/17 1213  TempSrc:   PainSc: 2                  Yon Schiffman

## 2017-03-30 LAB — SURGICAL PATHOLOGY

## 2017-03-30 LAB — CYTOLOGY - NON PAP

## 2018-10-03 ENCOUNTER — Other Ambulatory Visit
Admission: RE | Admit: 2018-10-03 | Discharge: 2018-10-03 | Disposition: A | Discharge status: Home / Self Care | Payer: Managed Care, Other (non HMO) | Source: Ambulatory Visit | Attending: Pediatrics | Admitting: Pediatrics

## 2018-10-03 DIAGNOSIS — R6889 Other general symptoms and signs: Secondary | ICD-10-CM | POA: Diagnosis present

## 2018-10-03 DIAGNOSIS — R0781 Pleurodynia: Secondary | ICD-10-CM | POA: Insufficient documentation

## 2018-10-03 LAB — FIBRIN DERIVATIVES D-DIMER (ARMC ONLY): Fibrin derivatives D-dimer (ARMC): 280 ng/mL (FEU) (ref 0.00–499.00)

## 2019-08-20 ENCOUNTER — Other Ambulatory Visit
Admission: RE | Admit: 2019-08-20 | Discharge: 2019-08-20 | Disposition: A | Discharge status: Home / Self Care | Payer: Managed Care, Other (non HMO) | Source: Ambulatory Visit | Attending: Ophthalmology | Admitting: Ophthalmology

## 2019-08-20 DIAGNOSIS — H3091 Unspecified chorioretinal inflammation, right eye: Secondary | ICD-10-CM | POA: Diagnosis not present

## 2019-08-21 LAB — RPR: RPR Ser Ql: NONREACTIVE

## 2019-08-22 LAB — QUANTIFERON-TB GOLD PLUS (RQFGPL)
QuantiFERON Mitogen Value: >10 IU/mL
QuantiFERON Nil Value: 0.04 IU/mL
QuantiFERON TB1 Ag Value: 0.04 IU/mL
QuantiFERON TB2 Ag Value: 0.04 IU/mL

## 2019-08-22 LAB — QUANTIFERON-TB GOLD PLUS: QuantiFERON-TB Gold Plus: NEGATIVE
